# Patient Record
Sex: Female | Born: 1950 | Race: White | Hispanic: No | Smoking: Never smoker
Health system: Southern US, Community
[De-identification: ages and names within clinical notes are randomized; demographics above are authoritative.]

## PROBLEM LIST (undated history)

## (undated) DIAGNOSIS — T8859XA Other complications of anesthesia, initial encounter: Secondary | ICD-10-CM

## (undated) DIAGNOSIS — I10 Essential (primary) hypertension: Secondary | ICD-10-CM

## (undated) DIAGNOSIS — E119 Type 2 diabetes mellitus without complications: Secondary | ICD-10-CM

## (undated) DIAGNOSIS — G459 Transient cerebral ischemic attack, unspecified: Secondary | ICD-10-CM

## (undated) DIAGNOSIS — E7800 Pure hypercholesterolemia, unspecified: Secondary | ICD-10-CM

## (undated) DIAGNOSIS — G709 Myoneural disorder, unspecified: Secondary | ICD-10-CM

## (undated) DIAGNOSIS — F419 Anxiety disorder, unspecified: Secondary | ICD-10-CM

## (undated) DIAGNOSIS — M1990 Unspecified osteoarthritis, unspecified site: Secondary | ICD-10-CM

## (undated) HISTORY — PX: ABDOMINAL HYSTERECTOMY: SHX81

## (undated) HISTORY — PX: LAPAROSCOPY: SHX197

## (undated) HISTORY — PX: CERVICAL CONE BIOPSY: SUR198

## (undated) HISTORY — PX: DILATION AND CURETTAGE OF UTERUS: SHX78

---

## 2008-08-29 ENCOUNTER — Ambulatory Visit (HOSPITAL_COMMUNITY): Admission: RE | Admit: 2008-08-29 | Discharge: 2008-08-29 | Payer: Self-pay | Admitting: Family Medicine

## 2009-06-15 ENCOUNTER — Ambulatory Visit: Payer: Self-pay | Admitting: Diagnostic Radiology

## 2009-06-15 ENCOUNTER — Emergency Department (HOSPITAL_BASED_OUTPATIENT_CLINIC_OR_DEPARTMENT_OTHER): Admission: EM | Admit: 2009-06-15 | Discharge: 2009-06-15 | Payer: Self-pay | Admitting: Emergency Medicine

## 2009-09-09 ENCOUNTER — Ambulatory Visit (HOSPITAL_COMMUNITY): Admission: RE | Admit: 2009-09-09 | Discharge: 2009-09-09 | Payer: Self-pay | Admitting: Family Medicine

## 2012-05-04 ENCOUNTER — Emergency Department (HOSPITAL_BASED_OUTPATIENT_CLINIC_OR_DEPARTMENT_OTHER)

## 2012-05-04 ENCOUNTER — Encounter (HOSPITAL_BASED_OUTPATIENT_CLINIC_OR_DEPARTMENT_OTHER): Payer: Self-pay | Admitting: *Deleted

## 2012-05-04 ENCOUNTER — Emergency Department (HOSPITAL_BASED_OUTPATIENT_CLINIC_OR_DEPARTMENT_OTHER)
Admission: EM | Admit: 2012-05-04 | Discharge: 2012-05-04 | Disposition: A | Discharge status: Home / Self Care | Attending: Emergency Medicine | Admitting: Emergency Medicine

## 2012-05-04 DIAGNOSIS — Z79899 Other long term (current) drug therapy: Secondary | ICD-10-CM | POA: Insufficient documentation

## 2012-05-04 DIAGNOSIS — Z8673 Personal history of transient ischemic attack (TIA), and cerebral infarction without residual deficits: Secondary | ICD-10-CM | POA: Insufficient documentation

## 2012-05-04 DIAGNOSIS — R110 Nausea: Secondary | ICD-10-CM | POA: Insufficient documentation

## 2012-05-04 DIAGNOSIS — I10 Essential (primary) hypertension: Secondary | ICD-10-CM | POA: Insufficient documentation

## 2012-05-04 DIAGNOSIS — E7800 Pure hypercholesterolemia, unspecified: Secondary | ICD-10-CM | POA: Insufficient documentation

## 2012-05-04 DIAGNOSIS — R51 Headache: Secondary | ICD-10-CM | POA: Insufficient documentation

## 2012-05-04 HISTORY — DX: Anxiety disorder, unspecified: F41.9

## 2012-05-04 HISTORY — DX: Essential (primary) hypertension: I10

## 2012-05-04 HISTORY — DX: Pure hypercholesterolemia, unspecified: E78.00

## 2012-05-04 HISTORY — DX: Transient cerebral ischemic attack, unspecified: G45.9

## 2012-05-04 LAB — COMPREHENSIVE METABOLIC PANEL
AST: 28 U/L (ref 0–37)
Chloride: 102 mEq/L (ref 96–112)
Creatinine, Ser: 1 mg/dL (ref 0.50–1.10)
Glucose, Bld: 121 mg/dL — ABNORMAL HIGH (ref 70–99)
Sodium: 140 mEq/L (ref 135–145)
Total Protein: 7.6 g/dL (ref 6.0–8.3)

## 2012-05-04 LAB — CBC
HCT: 44.7 % (ref 36.0–46.0)
MCV: 83.7 fL (ref 78.0–100.0)
Platelets: 294 10*3/uL (ref 150–400)
RBC: 5.34 MIL/uL — ABNORMAL HIGH (ref 3.87–5.11)

## 2012-05-04 LAB — DIFFERENTIAL
Eosinophils Absolute: 0.1 10*3/uL (ref 0.0–0.7)
Lymphocytes Relative: 32 % (ref 12–46)
Monocytes Absolute: 0.5 10*3/uL (ref 0.1–1.0)
Monocytes Relative: 8 % (ref 3–12)
Neutro Abs: 3.4 10*3/uL (ref 1.7–7.7)

## 2012-05-04 MED ORDER — MECLIZINE HCL 25 MG PO TABS: 25.0000 mg | ORAL_TABLET | Freq: Three times a day (TID) | ORAL | Status: AC | PRN

## 2012-05-04 NOTE — ED Notes (Signed)
Patient ambulatory to triage; gait steady.  Patient reports headache and a "funny feeling" that started on Tuesday.

## 2012-05-04 NOTE — Discharge Instructions (Signed)

## 2012-05-04 NOTE — ED Provider Notes (Signed)
History     CSN: [id number]  Arrival date & time 05/04/12  1017   First MD Initiated Contact with Patient 05/04/12 1116      Chief Complaint  Patient presents with  . Headache  . Nausea    (Consider location/radiation/quality/duration/timing/severity/associated sxs/prior treatment) HPI Comments: Patient states has felt like vertigo coming on for several days.  Today started with headache, nausea. No injury or trauma.  No fevers or chills.  Patient is a 61 y.o. female presenting with headaches. The history is provided by the patient.  Headache  This is a new problem. Episode onset: this morning. The problem occurs constantly. The problem has not changed since onset.The headache is associated with nothing. The pain is located in the bilateral region. The quality of the pain is described as dull. The pain is moderate. The pain does not radiate. Associated symptoms include nausea. Pertinent negatives include no fever, no malaise/fatigue and no vomiting.    Past Medical History  Diagnosis Date  . TIA (transient ischemic attack)   . Anxiety   . Hypertension   . Hypercholesteremia   . Migraine     History reviewed. No pertinent past surgical history.  History reviewed. No pertinent family history.  History  Substance Use Topics  . Smoking status: Not on file  . Smokeless tobacco: Not on file  . Alcohol Use:     OB History    Grav Para Term Preterm Abortions TAB SAB Ect Mult Living                  Review of Systems  Constitutional: Negative for fever and malaise/fatigue.  Gastrointestinal: Positive for nausea. Negative for vomiting.  Neurological: Positive for headaches.  All other systems reviewed and are negative.    Allergies  Bactrim; Sulfa antibiotics; and Toprol xl  Home Medications   Current Outpatient Rx  Name Route Sig Dispense Refill  . AMLODIPINE BESYLATE 5 MG PO TABS Oral Take 5 mg by mouth daily.    Marland Kitchen ESTROGENS, CONJUGATED 0.625 MG/GM VA CREA  Vaginal Place vaginally daily.    Marland Kitchen GERITOL COMPLETE PO Oral Take by mouth.    Marland Kitchen PROPANTHELINE BROMIDE 15 MG PO TABS Oral Take 15 mg by mouth 4 (four) times daily.    Marland Kitchen ROSUVASTATIN CALCIUM 10 MG PO TABS Oral Take 10 mg by mouth daily.      BP 129/86  Pulse 71  Temp 98.5 F (36.9 C) (Oral)  Resp 20  Ht 5\' 6"  (1.676 m)  Wt 195 lb (88.451 kg)  BMI 31.47 kg/m2  SpO2 98%  Physical Exam  Nursing note and vitals reviewed. Constitutional: She is oriented to person, place, and time. She appears well-developed and well-nourished. No distress.  HENT:  Head: Normocephalic and atraumatic.  Eyes: EOM are normal. Pupils are equal, round, and reactive to light.  Neck: Normal range of motion. Neck supple.  Cardiovascular: Normal rate and regular rhythm.  Exam reveals no gallop and no friction rub.   No murmur heard. Pulmonary/Chest: Effort normal and breath sounds normal. No respiratory distress. She has no wheezes.  Abdominal: Soft. Bowel sounds are normal. She exhibits no distension. There is no tenderness.  Musculoskeletal: Normal range of motion.  Neurological: She is alert and oriented to person, place, and time. No cranial nerve deficit. She exhibits normal muscle tone. Coordination normal.  Skin: Skin is warm and dry. She is not diaphoretic.    ED Course  Procedures (including critical care time)  Labs Reviewed  CBC  DIFFERENTIAL  COMPREHENSIVE METABOLIC PANEL   No results found.   No diagnosis found.   Date: 05/04/2012  Rate: 63  Rhythm: normal sinus rhythm  QRS Axis: normal  Intervals: normal  ST/T Wave abnormalities: normal  Conduction Disutrbances:none  Narrative Interpretation:   Old EKG Reviewed: none available    MDM  The labs, ct, and ekg all look okay.  There is no sign of mass, bleed, and I do not feel as though an LP is indicated.  She seems quite anxious and I suspect this is adding to the way she feels.  She will be discharged to home, to return prn if  worsens and see pcp if she does not improve.        Geoffery Lyons, MD 05/04/12 1243

## 2012-05-04 NOTE — ED Notes (Signed)
Pt amb to room 4 with quick steady gait in nad. Pt reports ha since yesterday with some nausea. On Tuesday had her usual vertigo symptoms, but those resolved, ha and nausea continue.

## 2012-06-02 ENCOUNTER — Other Ambulatory Visit (HOSPITAL_COMMUNITY): Payer: Self-pay | Admitting: Family Medicine

## 2012-06-02 ENCOUNTER — Ambulatory Visit (HOSPITAL_COMMUNITY)
Admission: RE | Admit: 2012-06-02 | Discharge: 2012-06-02 | Disposition: A | Discharge status: Home / Self Care | Source: Ambulatory Visit | Attending: Family Medicine | Admitting: Family Medicine

## 2012-06-02 DIAGNOSIS — M1990 Unspecified osteoarthritis, unspecified site: Secondary | ICD-10-CM

## 2012-06-02 DIAGNOSIS — M79609 Pain in unspecified limb: Secondary | ICD-10-CM | POA: Insufficient documentation

## 2012-06-02 DIAGNOSIS — M25569 Pain in unspecified knee: Secondary | ICD-10-CM | POA: Insufficient documentation

## 2014-06-20 IMAGING — CR DG TIBIA/FIBULA 2V*R*
2 series · 2 of 2 positions shown · non-contrast
Comparison: None.

CLINICAL DATA: Fell.  Lower extremity pain.

RIGHT TIBIA AND FIBULA - 2 VIEW

[view not recorded (1 of 2)]
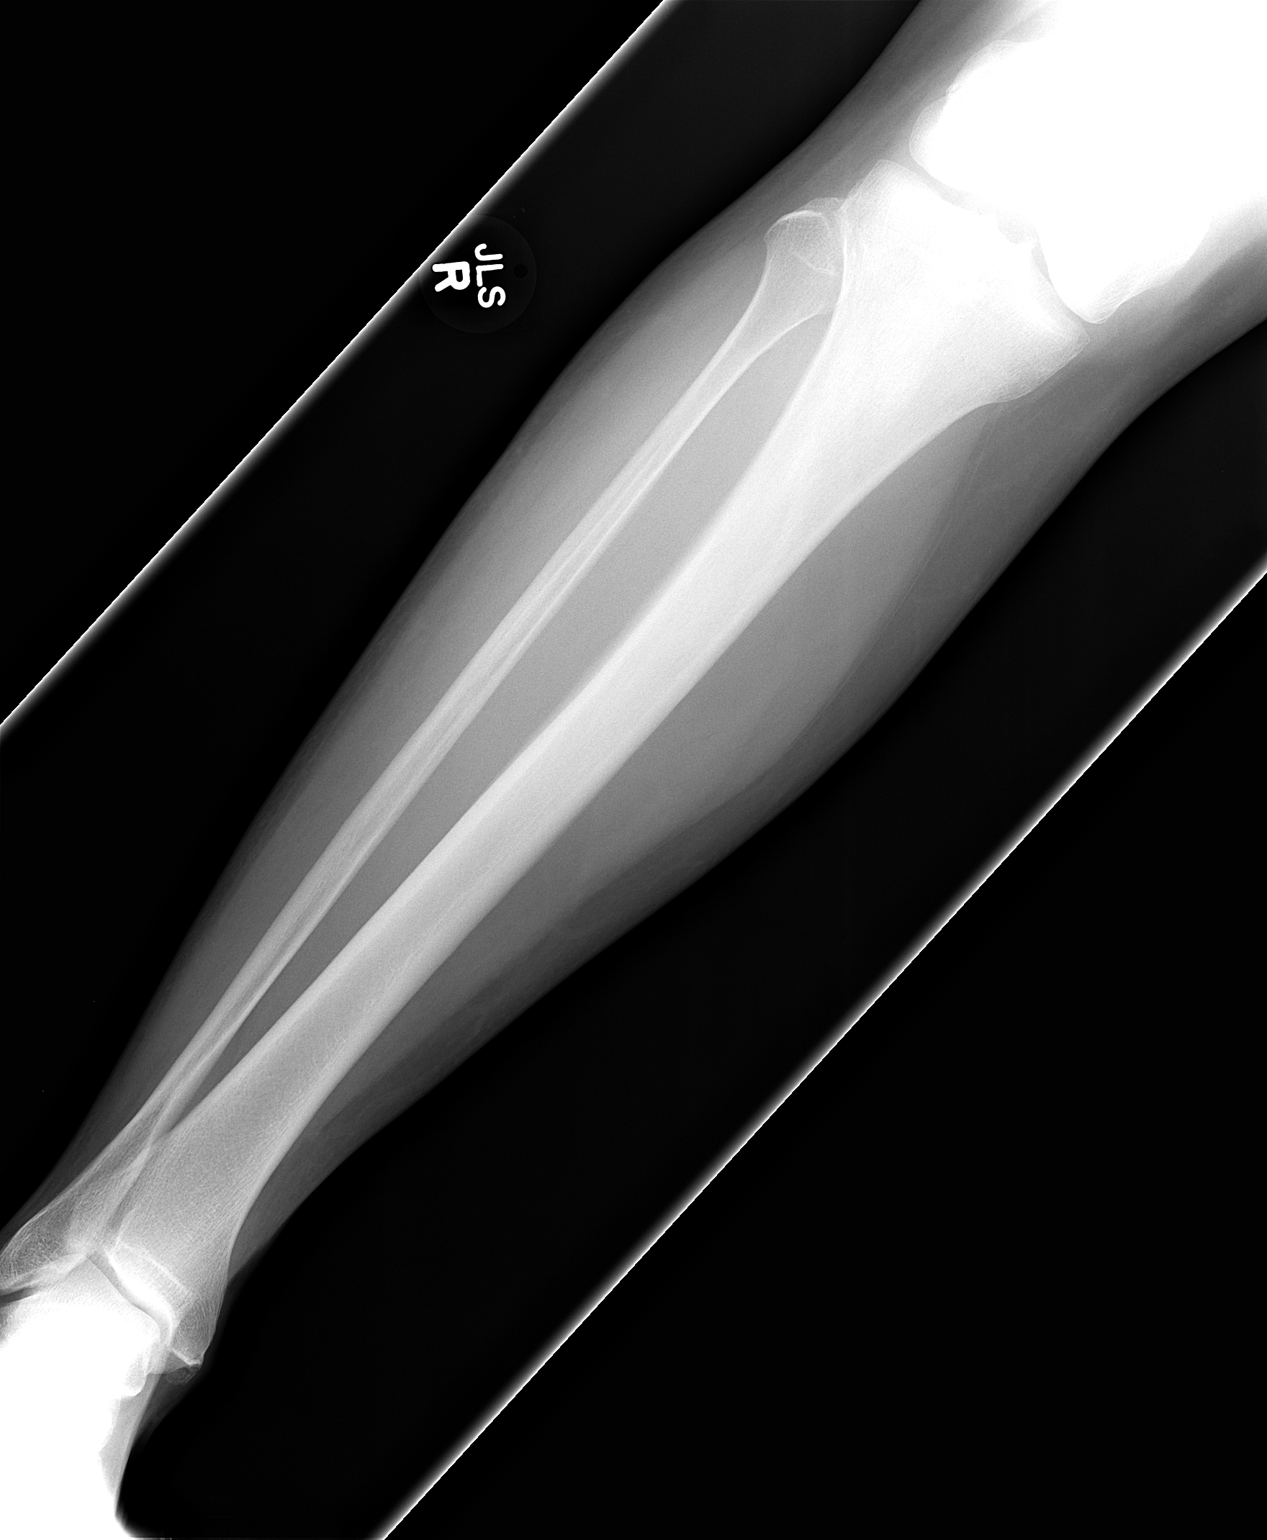

[view not recorded (2 of 2)]
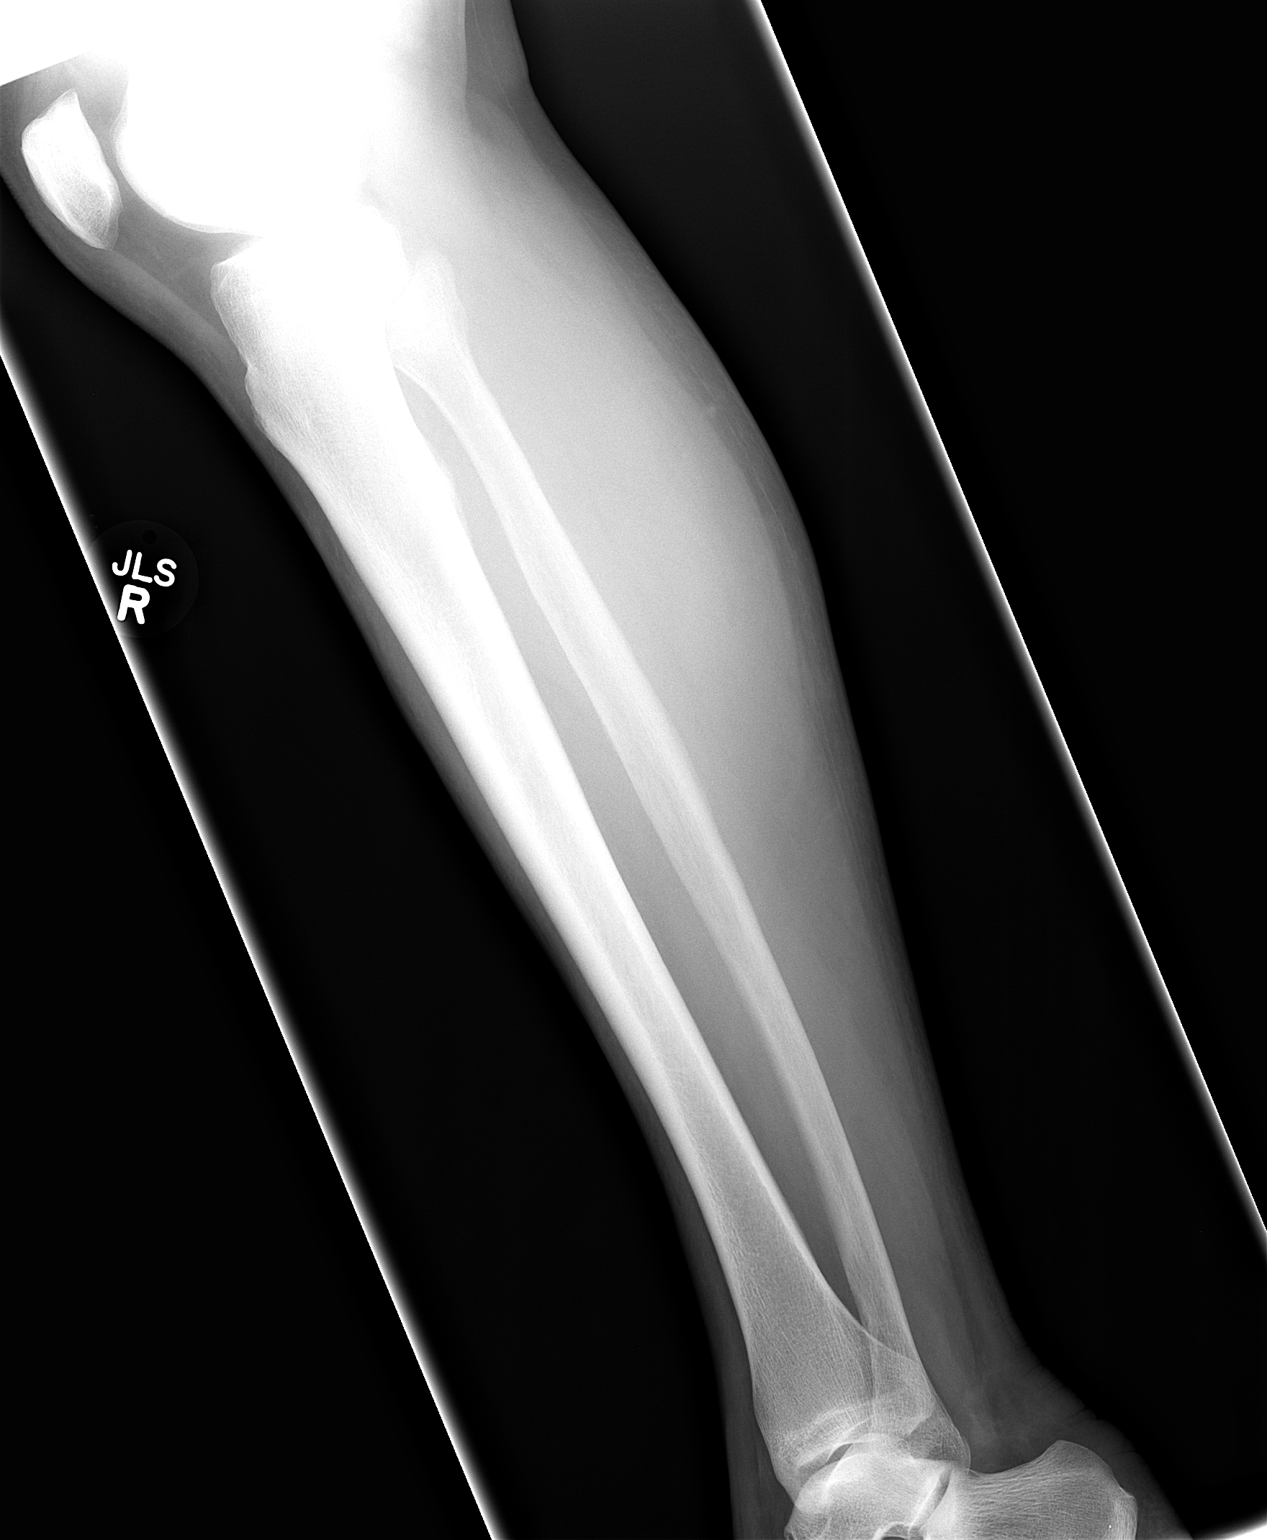

[2 of 2 positions shown; findings below may reference images not displayed]

FINDINGS: No evidence of fracture or other focal bony lesion.  No
soft tissue finding.
IMPRESSION: Negative radiographs

## 2015-02-09 ENCOUNTER — Emergency Department (HOSPITAL_BASED_OUTPATIENT_CLINIC_OR_DEPARTMENT_OTHER)
Admission: EM | Admit: 2015-02-09 | Discharge: 2015-02-09 | Disposition: A | Discharge status: Home / Self Care | Attending: Emergency Medicine | Admitting: Emergency Medicine

## 2015-02-09 ENCOUNTER — Emergency Department (HOSPITAL_BASED_OUTPATIENT_CLINIC_OR_DEPARTMENT_OTHER)

## 2015-02-09 ENCOUNTER — Encounter (HOSPITAL_BASED_OUTPATIENT_CLINIC_OR_DEPARTMENT_OTHER): Payer: Self-pay

## 2015-02-09 DIAGNOSIS — Z79899 Other long term (current) drug therapy: Secondary | ICD-10-CM | POA: Insufficient documentation

## 2015-02-09 DIAGNOSIS — I10 Essential (primary) hypertension: Secondary | ICD-10-CM | POA: Insufficient documentation

## 2015-02-09 DIAGNOSIS — W010XXA Fall on same level from slipping, tripping and stumbling without subsequent striking against object, initial encounter: Secondary | ICD-10-CM | POA: Diagnosis not present

## 2015-02-09 DIAGNOSIS — Z8673 Personal history of transient ischemic attack (TIA), and cerebral infarction without residual deficits: Secondary | ICD-10-CM | POA: Insufficient documentation

## 2015-02-09 DIAGNOSIS — Z7982 Long term (current) use of aspirin: Secondary | ICD-10-CM | POA: Insufficient documentation

## 2015-02-09 DIAGNOSIS — E780 Pure hypercholesterolemia: Secondary | ICD-10-CM | POA: Insufficient documentation

## 2015-02-09 DIAGNOSIS — F419 Anxiety disorder, unspecified: Secondary | ICD-10-CM | POA: Diagnosis not present

## 2015-02-09 DIAGNOSIS — Y9289 Other specified places as the place of occurrence of the external cause: Secondary | ICD-10-CM | POA: Diagnosis not present

## 2015-02-09 DIAGNOSIS — Y9389 Activity, other specified: Secondary | ICD-10-CM | POA: Insufficient documentation

## 2015-02-09 DIAGNOSIS — S93402A Sprain of unspecified ligament of left ankle, initial encounter: Secondary | ICD-10-CM

## 2015-02-09 DIAGNOSIS — S99912A Unspecified injury of left ankle, initial encounter: Secondary | ICD-10-CM | POA: Diagnosis present

## 2015-02-09 DIAGNOSIS — Y998 Other external cause status: Secondary | ICD-10-CM | POA: Insufficient documentation

## 2015-02-09 MED ORDER — ACETAMINOPHEN 325 MG PO TABS
975.0000 mg | ORAL_TABLET | Freq: Once | ORAL | Status: AC
  Administered 2015-02-09: 975 mg via ORAL
  Filled 2015-02-09: qty 3

## 2015-02-09 NOTE — Discharge Instructions (Signed)
Rest, Ice intermittently (in the first 24-48 hours), Gentle compression with an Ace wrap, and elevate (Limb above the level of the heart)   Take up to  of ibuprofen (that is usually 4 over the counter pills)  3 times a day for 5 days. Take with food.  Take acetaminophen (Tylenol) up to 975 mg (this is normally 3 over-the-counter pills) up to 3 times a day. Do not drink alcohol. Make sure your other medications do not contain acetaminophen (Read the labels!)  Please follow with your primary care doctor in the next 2 days for a check-up. They must obtain records for further management.   Do not hesitate to return to the Emergency Department for any new, worsening or concerning symptoms.    Ankle Sprain An ankle sprain is an injury to the strong, fibrous tissues (ligaments) that hold the bones of your ankle joint together.  CAUSES An ankle sprain is usually caused by a fall or by twisting your ankle. Ankle sprains most commonly occur when you step on the outer edge of your foot, and your ankle turns inward. People who participate in sports are more prone to these types of injuries.  SYMPTOMS   Pain in your ankle. The pain may be present at rest or only when you are trying to stand or walk.  Swelling.  Bruising. Bruising may develop immediately or within 1 to 2 days after your injury.  Difficulty standing or walking, particularly when turning corners or changing directions. DIAGNOSIS  Your caregiver will ask you details about your injury and perform a physical exam of your ankle to determine if you have an ankle sprain. During the physical exam, your caregiver will press on and apply pressure to specific areas of your foot and ankle. Your caregiver will try to move your ankle in certain ways. An X-ray exam may be done to be sure a bone was not broken or a ligament did not separate from one of the bones in your ankle (avulsion fracture).  TREATMENT  Certain types of braces can help  stabilize your ankle. Your caregiver can make a recommendation for this. Your caregiver may recommend the use of medicine for pain. If your sprain is severe, your caregiver may refer you to a surgeon who helps to restore function to parts of your skeletal system (orthopedist) or a physical therapist. HOME CARE INSTRUCTIONS   Apply ice to your injury for 1-2 days or as directed by your caregiver. Applying ice helps to reduce inflammation and pain.  Put ice in a plastic bag.  Place a towel between your skin and the bag.  Leave the ice on for 15-20 minutes at a time, every 2 hours while you are awake.  Only take over-the-counter or prescription medicines for pain, discomfort, or fever as directed by your caregiver.  Elevate your injured ankle above the level of your heart as much as possible for 2-3 days.  If your caregiver recommends crutches, use them as instructed. Gradually put weight on the affected ankle. Continue to use crutches or a cane until you can walk without feeling pain in your ankle.  If you have a plaster splint, wear the splint as directed by your caregiver. Do not rest it on anything harder than a pillow for the first 24 hours. Do not put weight on it. Do not get it wet. You may take it off to take a shower or bath.  You may have been given an elastic bandage to wear around your  ankle to provide support. If the elastic bandage is too tight (you have numbness or tingling in your foot or your foot becomes cold and blue), adjust the bandage to make it comfortable.  If you have an air splint, you may blow more air into it or let air out to make it more comfortable. You may take your splint off at night and before taking a shower or bath. Wiggle your toes in the splint several times per day to decrease swelling. SEEK MEDICAL CARE IF:   You have rapidly increasing bruising or swelling.  Your toes feel extremely cold or you lose feeling in your foot.  Your pain is not relieved  with medicine. SEEK IMMEDIATE MEDICAL CARE IF:  Your toes are numb or blue.  You have severe pain that is increasing. MAKE SURE YOU:   Understand these instructions.  Will watch your condition.  Will get help right away if you are not doing well or get worse. Document Released: 11/01/2005 Document Revised: 07/26/2012 Document Reviewed: 11/13/2011 [facility] Patient Information 2015 [place] [place]. This information is not intended to replace advice given to you by your health care provider. Make sure you discuss any questions you have with your health care provider.

## 2015-02-09 NOTE — ED Notes (Signed)
PA at bedside.

## 2015-02-09 NOTE — ED Provider Notes (Signed)
CSN: [id number]     Arrival date & time 02/09/15  1236 History   First MD Initiated Contact with Patient 02/09/15 1329     Chief Complaint  Patient presents with  . Ankle Injury     (Consider location/radiation/quality/duration/timing/severity/associated sxs/prior Treatment) HPI  Madison Tate is a 64 y.o. female complaining of moderate pain to left lateral ankle status post slip and fall last night, patient has been ambulatory with extreme pain and difficulty. Patient states that she was working in the yard last night, her foot got tangled in an extension cord and she fell down 2 steps. There was no head trauma, cervicalgia, chest pain, abdominal pain, difficulty moving major joints besides the left ankle. Rates her pain is 7 out of 10, she is taking Motrin at home with little relief. She denies weakness, numbness, history of prior trauma or surgeries to the affected joint.   Past Medical History  Diagnosis Date  . TIA (transient ischemic attack)   . Anxiety   . Hypertension   . Hypercholesteremia    History reviewed. No pertinent past surgical history. No family history on file. History  Substance Use Topics  . Smoking status: Never Smoker   . Smokeless tobacco: Not on file  . Alcohol Use: Not on file   OB History    No data available     Review of Systems  10 systems reviewed and found to be negative, except as noted in the HPI.   Allergies  Bactrim; Sulfa antibiotics; and Toprol xl  Home Medications   Prior to Admission medications   Medication Sig Start Date End Date Taking? Authorizing Provider  aspirin 325 MG tablet Take 325 mg by mouth daily.   Yes Historical Provider, MD  propranolol (INDERAL) 20 MG tablet Take 20 mg by mouth 3 (three) times daily.   Yes Historical Provider, MD  amLODipine (NORVASC) 5 MG tablet Take 5 mg by mouth daily.    Historical Provider, MD  conjugated estrogens (PREMARIN) vaginal cream Place vaginally daily.    Historical Provider, MD   Iron-Vitamins (GERITOL COMPLETE PO) Take by mouth.    Historical Provider, MD  propantheline (PROBANTHINE) 15 MG tablet Take 15 mg by mouth 4 (four) times daily.    Historical Provider, MD  rosuvastatin (CRESTOR) 10 MG tablet Take 10 mg by mouth daily.    Historical Provider, MD   BP 121/61 mmHg  Pulse 73  Temp(Src) 98.6 F (37 C) (Oral)  Resp 18  SpO2 98% Physical Exam  Constitutional: She is oriented to person, place, and time. She appears well-developed and well-nourished. No distress.  HENT:  Head: Normocephalic.  Eyes: Conjunctivae and EOM are normal.  Cardiovascular: Normal rate.   Pulmonary/Chest: Effort normal. No stridor.  Musculoskeletal: Normal range of motion. She exhibits edema and tenderness.  Mild to moderate swelling and tenderness palpation along the left lateral malleolus inferior area. Neurovascularly intact. Overlying skin is intact.  Neurological: She is alert and oriented to person, place, and time.  Psychiatric: She has a normal mood and affect.  Nursing note and vitals reviewed.   ED Course  Procedures (including critical care time) Labs Review Labs Reviewed - No data to display  Imaging Review Dg Ankle Complete Left  02/09/2015   CLINICAL DATA:  Tripped and fell, left ankle pain and trauma yesterday  EXAM: LEFT ANKLE COMPLETE - 3+ VIEW  COMPARISON:  None.  FINDINGS: Mild lateral malleolar soft tissue swelling is evident. The mortise is symmetric. No fracture  or dislocation is identified. No radiopaque foreign body.  IMPRESSION: Mild lateral malleolar soft tissue swelling which may indicate ligamentous injury.   Electronically Signed   By: Christiana Pellant M.D.   On: 02/09/2015 13:22     EKG Interpretation None      MDM   Final diagnoses:  Ankle sprain, left, initial encounter    Filed Vitals:   02/09/15 1248  BP: 121/61  Pulse: 73  Temp: 98.6 F (37 C)  TempSrc: Oral  Resp: 18  SpO2: 98%    Madison Tate is a pleasant 64 y.o. female  presenting with ankle pain status post slip and fall last night.Neurovascularly intact with mild tenderness to palpation. X-rays negative. Will treat with rest, ice, compression elevation, NSAIDS. Patient given crutches and an orthopedic follow-up.  Patient declines pain medication in the ED.  Evaluation does not show pathology that would require ongoing emergent intervention or inpatient treatment. Pt is hemodynamically stable and mentating appropriately. Discussed findings and plan with patient/guardian, who agrees with care plan. All questions answered. Return precautions discussed and outpatient follow up given.       Wynetta Emery, PA-C 02/09/15 1408  Jerelyn Scott, MD 02/09/15 1409

## 2015-02-09 NOTE — ED Notes (Signed)
Pt requesting pain meds, p.a notified.

## 2015-02-09 NOTE — ED Notes (Signed)
Pt reports last night twisted L ankle, swelling and tenderness with some bruising to L lateral ankle and L anterior foot area.  Able to wiggle toes, reported numbness to toe area.  Reports unable to bear weight d/t pain currently.

## 2015-02-09 NOTE — ED Notes (Signed)
Patient here with left ankle injury after tripping and falling last pm, pain with any attempt to ambulate

## 2017-02-26 IMAGING — DX DG ANKLE COMPLETE 3+V*L*
3 series · 3 of 3 positions shown · non-contrast
Comparison: None.

CLINICAL DATA: Tripped and fell, left ankle pain and trauma
yesterday

EXAM:
LEFT ANKLE COMPLETE - 3+ VIEW

[ankle ap]
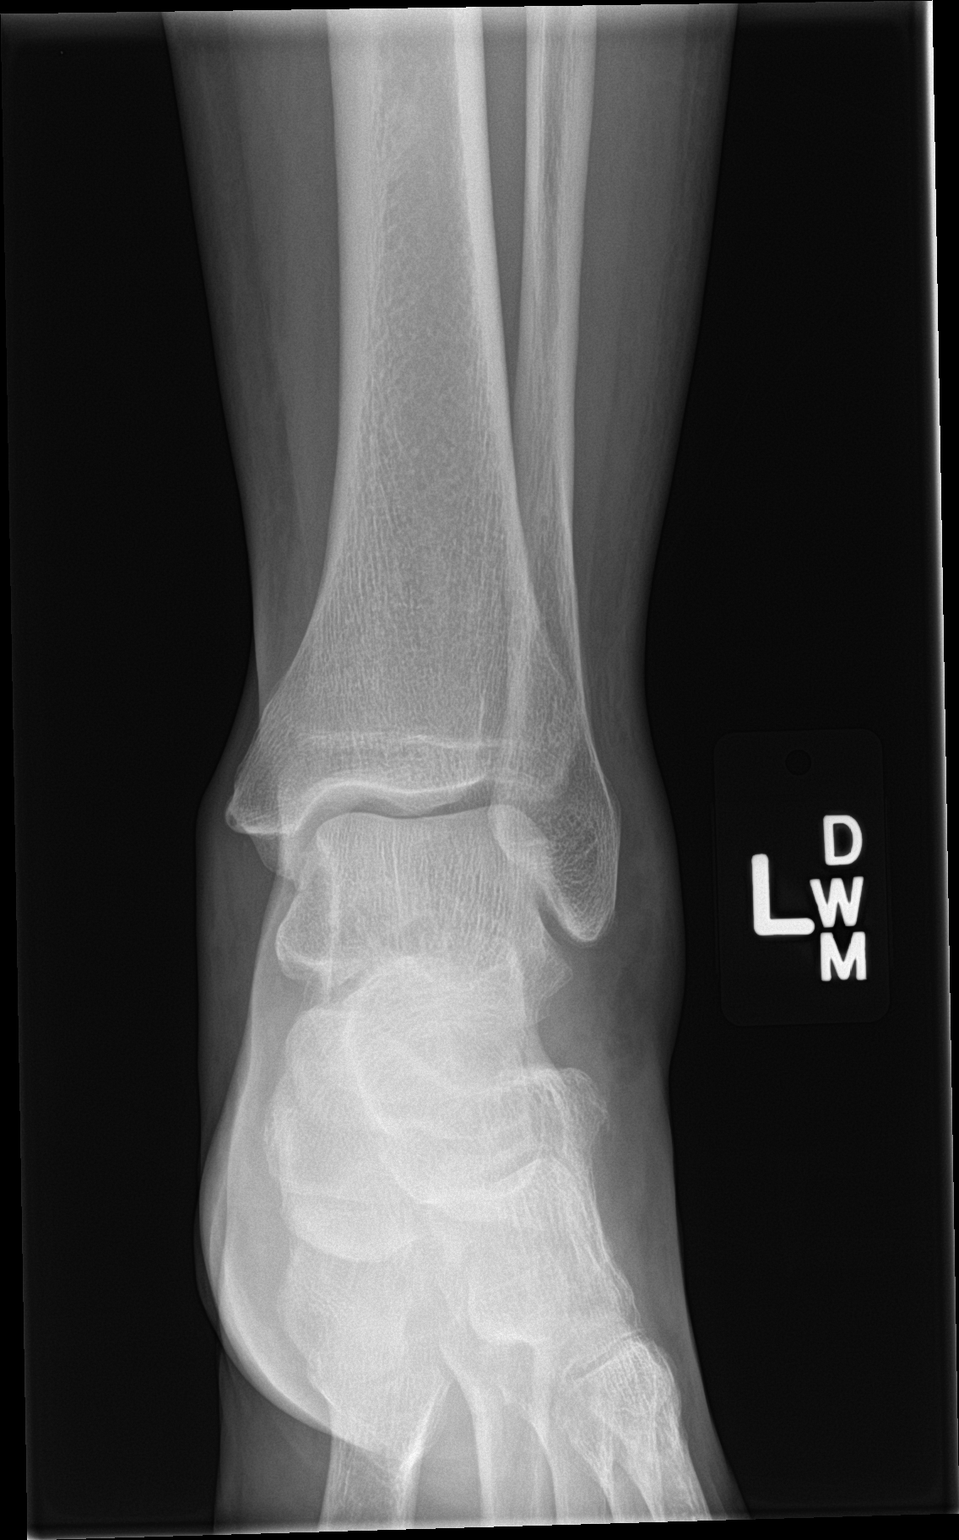

[ankle obl]
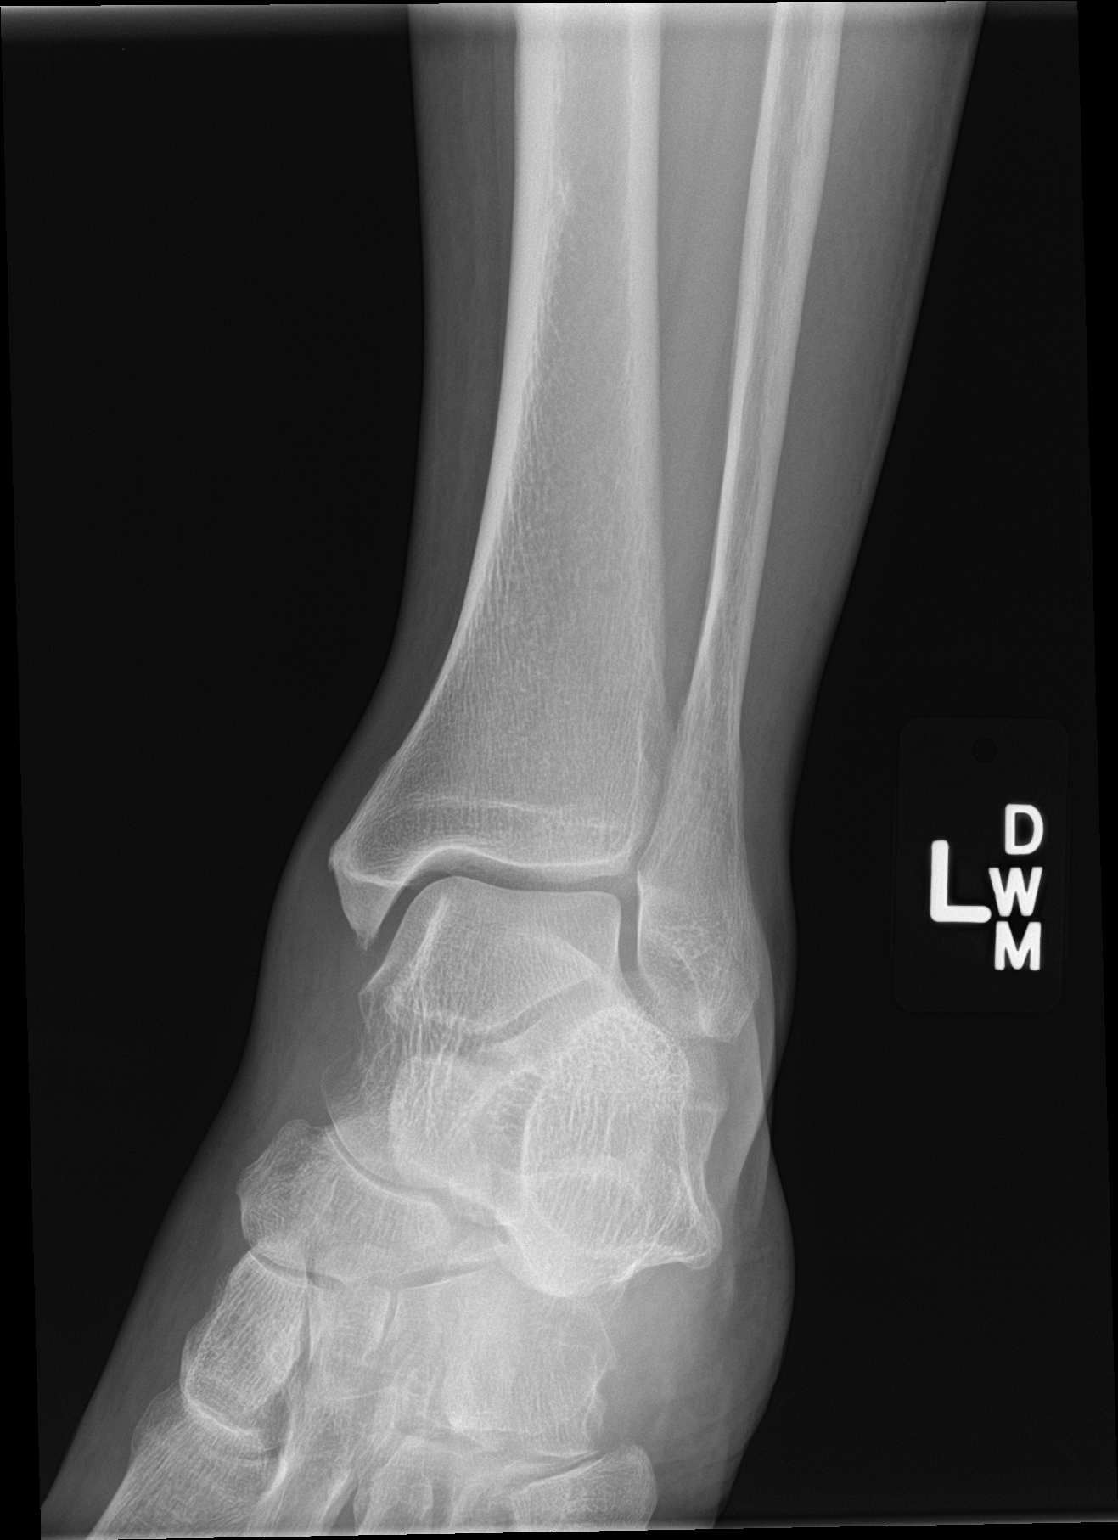

[ankle lat]
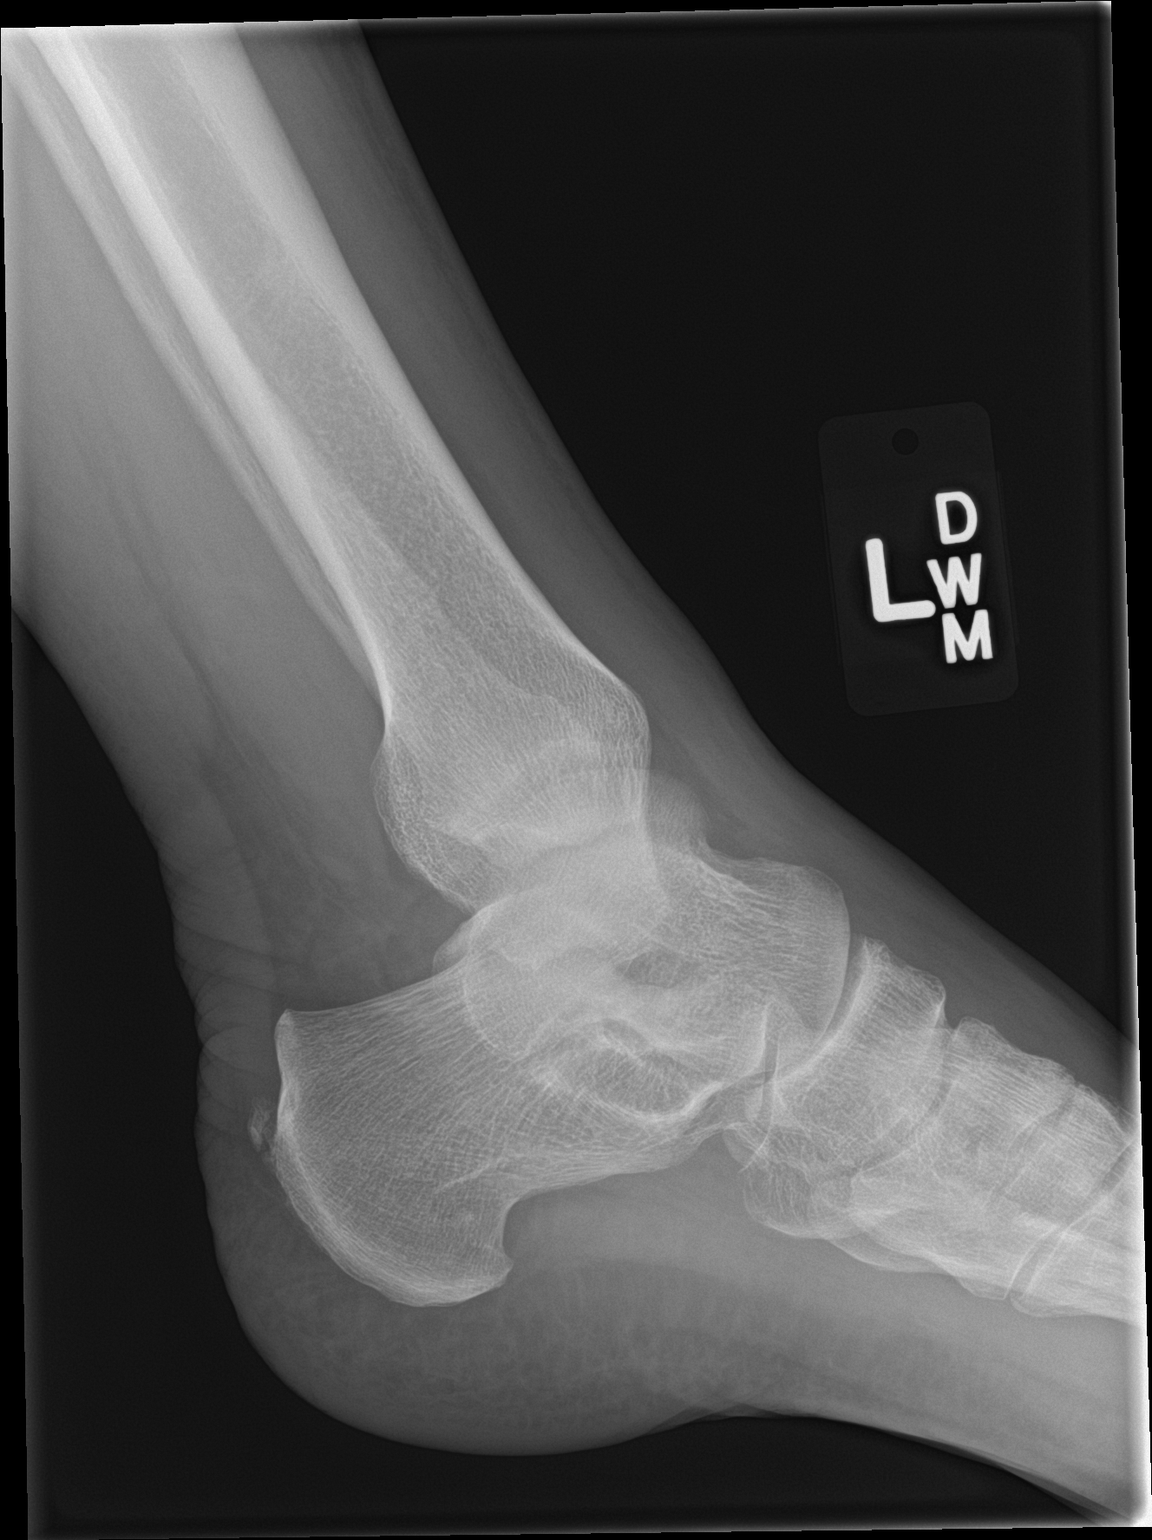

[3 of 3 positions shown; findings below may reference images not displayed]

FINDINGS: Mild lateral malleolar soft tissue swelling is evident. The mortise
is symmetric. No fracture or dislocation is identified. No
radiopaque foreign body.
IMPRESSION: Mild lateral malleolar soft tissue swelling which may indicate
ligamentous injury.

## 2019-06-03 ENCOUNTER — Encounter (HOSPITAL_BASED_OUTPATIENT_CLINIC_OR_DEPARTMENT_OTHER): Payer: Self-pay

## 2019-06-03 ENCOUNTER — Other Ambulatory Visit: Payer: Self-pay

## 2019-06-03 ENCOUNTER — Emergency Department (HOSPITAL_BASED_OUTPATIENT_CLINIC_OR_DEPARTMENT_OTHER)
Admission: EM | Admit: 2019-06-03 | Discharge: 2019-06-03 | Disposition: A | Discharge status: Home / Self Care | Payer: Medicare Other | Attending: Emergency Medicine | Admitting: Emergency Medicine

## 2019-06-03 DIAGNOSIS — R630 Anorexia: Secondary | ICD-10-CM | POA: Insufficient documentation

## 2019-06-03 DIAGNOSIS — Z8673 Personal history of transient ischemic attack (TIA), and cerebral infarction without residual deficits: Secondary | ICD-10-CM | POA: Insufficient documentation

## 2019-06-03 DIAGNOSIS — W57XXXA Bitten or stung by nonvenomous insect and other nonvenomous arthropods, initial encounter: Secondary | ICD-10-CM | POA: Diagnosis not present

## 2019-06-03 DIAGNOSIS — H9201 Otalgia, right ear: Secondary | ICD-10-CM | POA: Diagnosis not present

## 2019-06-03 DIAGNOSIS — Z20828 Contact with and (suspected) exposure to other viral communicable diseases: Secondary | ICD-10-CM | POA: Diagnosis not present

## 2019-06-03 DIAGNOSIS — I10 Essential (primary) hypertension: Secondary | ICD-10-CM | POA: Diagnosis not present

## 2019-06-03 DIAGNOSIS — R509 Fever, unspecified: Secondary | ICD-10-CM | POA: Insufficient documentation

## 2019-06-03 DIAGNOSIS — Z7982 Long term (current) use of aspirin: Secondary | ICD-10-CM | POA: Diagnosis not present

## 2019-06-03 DIAGNOSIS — Z79899 Other long term (current) drug therapy: Secondary | ICD-10-CM | POA: Diagnosis not present

## 2019-06-03 DIAGNOSIS — R5383 Other fatigue: Secondary | ICD-10-CM | POA: Insufficient documentation

## 2019-06-03 DIAGNOSIS — S30861A Insect bite (nonvenomous) of abdominal wall, initial encounter: Secondary | ICD-10-CM | POA: Diagnosis not present

## 2019-06-03 DIAGNOSIS — Y999 Unspecified external cause status: Secondary | ICD-10-CM | POA: Insufficient documentation

## 2019-06-03 DIAGNOSIS — E119 Type 2 diabetes mellitus without complications: Secondary | ICD-10-CM | POA: Insufficient documentation

## 2019-06-03 DIAGNOSIS — Y939 Activity, unspecified: Secondary | ICD-10-CM | POA: Insufficient documentation

## 2019-06-03 DIAGNOSIS — Y929 Unspecified place or not applicable: Secondary | ICD-10-CM | POA: Diagnosis not present

## 2019-06-03 HISTORY — DX: Type 2 diabetes mellitus without complications: E11.9

## 2019-06-03 LAB — URINALYSIS, ROUTINE W REFLEX MICROSCOPIC
Bilirubin Urine: NEGATIVE
Glucose, UA: NEGATIVE mg/dL
Ketones, ur: NEGATIVE mg/dL
Leukocytes,Ua: NEGATIVE
Nitrite: NEGATIVE
Protein, ur: NEGATIVE mg/dL
Specific Gravity, Urine: 1.025 (ref 1.005–1.030)
pH: 6 (ref 5.0–8.0)

## 2019-06-03 LAB — URINALYSIS, MICROSCOPIC (REFLEX)

## 2019-06-03 MED ORDER — DOXYCYCLINE HYCLATE 100 MG PO CAPS: 100.0000 mg | ORAL_CAPSULE | Freq: Two times a day (BID) | ORAL | 0 refills | Status: DC

## 2019-06-03 NOTE — ED Triage Notes (Signed)
Pt last took 325mg  of APAP at 20:30

## 2019-06-03 NOTE — ED Triage Notes (Signed)
Pt started having and ear ache on the R side that started on Tuesday. Pt states Friday she started running a low grade fever with TMax 100. Pt reports HA, body aches, weakness, and decreased appetite. Pt does have a "tick bite" on abd with localized erythema.

## 2019-06-03 NOTE — Discharge Instructions (Signed)
Follow-up with your primary care provider for recheck.  Return to emergency department if you have any worsening symptoms.  You need to be in quarantine until your [facility] test comes back or at least 10 days.

## 2019-06-03 NOTE — ED Provider Notes (Signed)
[place] EMERGENCY DEPARTMENT Provider Note   CSN: [id number] Arrival date & time: 06/03/19  2140    History   Chief Complaint Chief Complaint  Patient presents with  . Fever    HPI Madison Tate is a 68 y.o. female.     Patient is a 68 year old female who presents with fatigue and low-grade fevers.  She states that Monday which was about 6 days ago she had a dental cleaning.  The next day she felt like her gland was swollen a little bit on the outside of her neck and her ear was a little sore.  However she felt better the next day but following that she noticed that she had a decreased appetite and general fatigue.  She is had some ongoing low-grade fever since then up to 100.  She denies any URI symptoms.  No nausea or vomiting.  She has had a decreased appetite.  No abdominal pain.  No cough or chest pain.  No shortness of breath.  No urinary symptoms.  No ongoing or worsening ear pain.  She does state that she had a tick bite about 2 weeks ago when she pulled the tick off her abdomen.  Is been a little red in the area since that time.  She denies any other rashes.     Past Medical History:  Diagnosis Date  . Anxiety   . Diabetes mellitus without complication ([place])   . Hypercholesteremia   . Hypertension   . TIA (transient ischemic attack)     There are no active problems to display for this patient.   Past Surgical History:  Procedure Laterality Date  . ABDOMINAL HYSTERECTOMY       OB History   No obstetric history on file.      Home Medications    Prior to Admission medications   Medication Sig Start Date End Date Taking? Authorizing Provider  amLODipine (NORVASC) 5 MG tablet Take 5 mg by mouth daily.    [provider]  aspirin 325 MG tablet Take 325 mg by mouth daily.    [provider]  conjugated estrogens (PREMARIN) vaginal cream Place vaginally daily.    [provider]  doxycycline (VIBRAMYCIN) 100 MG capsule  Take 1 capsule (100 mg total) by mouth 2 (two) times daily. One po bid x 7 days 06/03/19   Malvin Johns, MD  Iron-Vitamins (GERITOL COMPLETE PO) Take by mouth.    [provider]  propantheline (PROBANTHINE) 15 MG tablet Take 15 mg by mouth 4 (four) times daily.    [provider]  propranolol (INDERAL) 20 MG tablet Take 20 mg by mouth 3 (three) times daily.    [provider]  rosuvastatin (CRESTOR) 10 MG tablet Take 10 mg by mouth daily.    [provider]    Family History No family history on file.  Social History Social History   Tobacco Use  . Smoking status: Never Smoker  Substance Use Topics  . Alcohol use: Not on file  . Drug use: Not on file     Allergies   Bactrim [sulfamethoxazole-trimethoprim], Sulfa antibiotics, Topiramate, and Toprol xl [metoprolol tartrate]   Review of Systems Review of Systems  Constitutional: Positive for appetite change, fatigue and fever. Negative for chills and diaphoresis.  HENT: Positive for ear pain. Negative for congestion, rhinorrhea and sneezing.   Eyes: Negative.   Respiratory: Negative for cough, chest tightness and shortness of breath.   Cardiovascular: Negative for chest pain  and leg swelling.  Gastrointestinal: Negative for abdominal pain, blood in stool, diarrhea, nausea and vomiting.  Genitourinary: Negative for difficulty urinating, flank pain, frequency and hematuria.  Musculoskeletal: Negative for arthralgias and back pain.  Skin: Positive for wound. Negative for rash.  Neurological: Negative for dizziness, speech difficulty, weakness, numbness and headaches.     Physical Exam Updated Vital Signs BP (!) 164/88 (BP Location: Right Arm)   Pulse 83   Temp 99.4 F (37.4 C) (Oral)   Resp 20   Ht 5\' 6"  (1.676 m)   Wt 89.4 kg   SpO2 97%   BMI 31.80 kg/m   Physical Exam Constitutional:      Appearance: She is well-developed.  HENT:     Head: Normocephalic and atraumatic.      Right Ear: Tympanic membrane normal.     Left Ear: Tympanic membrane normal.     Mouth/Throat:     Mouth: Mucous membranes are dry.     Pharynx: No oropharyngeal exudate or posterior oropharyngeal erythema.  Eyes:     Conjunctiva/sclera: Conjunctivae normal.     Pupils: Pupils are equal, round, and reactive to light.  Neck:     Musculoskeletal: Normal range of motion and neck supple.  Cardiovascular:     Rate and Rhythm: Normal rate and regular rhythm.     Heart sounds: Normal heart sounds.  Pulmonary:     Effort: Pulmonary effort is normal. No respiratory distress.     Breath sounds: Normal breath sounds. No wheezing or rales.  Chest:     Chest wall: No tenderness.  Abdominal:     General: Bowel sounds are normal.     Palpations: Abdomen is soft.     Tenderness: There is no abdominal tenderness. There is no guarding or rebound.  Musculoskeletal: Normal range of motion.  Lymphadenopathy:     Cervical: No cervical adenopathy.  Skin:    General: Skin is warm and dry.     Findings: No rash.     Comments: Patient has a small area of erythema on her anterior abdominal wall with a central scab.  There is no induration or fluctuance.  Neurological:     Mental Status: She is alert and oriented to person, place, and time.      ED Treatments / Results  Labs (all labs ordered are listed, but only abnormal results are displayed) Labs Reviewed  URINALYSIS, ROUTINE W REFLEX MICROSCOPIC - Abnormal; Notable for the following components:      Result Value   Hgb urine dipstick MODERATE (*)    All other components within normal limits  URINALYSIS, MICROSCOPIC (REFLEX) - Abnormal; Notable for the following components:   Bacteria, UA RARE (*)    All other components within normal limits  NOVEL CORONAVIRUS, NAA (HOSPITAL ORDER, SEND-OUT TO REF LAB)  ROCKY MTN SPOTTED FVR ABS PNL(IGG+IGM)  B. BURGDORFI ANTIBODIES    EKG None  Radiology No results found.  Procedures Procedures  (including critical care time)  Medications Ordered in ED Medications - No data to display   Initial Impression / Assessment and Plan / ED Course  I have reviewed the triage vital signs and the nursing notes.  Pertinent labs & imaging results that were available during my care of the patient were reviewed by me and considered in my medical decision making (see chart for details).        Patient is a 68 year old female who presents with fever and achiness.  She does not have any  clinical suggestions of pneumonia.  Her urine does not appear to be infected.  There is no evidence of a throat or ear infection.  COVID test is pending.  [facility] also [facility] and Lyme disease and given the tick bite, I will go ahead and start her on doxycycline.  She was encouraged to follow-up with her PCP.  Return precautions were given.  Final Clinical Impressions(s) / ED Diagnoses   Final diagnoses:  Febrile illness    ED Discharge Orders         Ordered    doxycycline (VIBRAMYCIN) 100 MG capsule  2 times daily     06/03/19 2249           Rolan BuccoBelfi, Teddi Badalamenti, MD 06/03/19 2251

## 2019-06-03 NOTE — ED Notes (Signed)
Pt understood dc material. NAD noted. Script given at dc. All questions answered to satisfaction. Pt escorted to check out window. 

## 2019-06-05 LAB — B. BURGDORFI ANTIBODIES: B burgdorferi Ab IgG+IgM: <0.91 {ISR} (ref 0.00–0.90)

## 2019-06-06 LAB — ROCKY MTN SPOTTED FVR ABS PNL(IGG+IGM)
RMSF IgG: NEGATIVE
RMSF IgM: 0.26 index (ref 0.00–0.89)

## 2019-06-06 LAB — NOVEL CORONAVIRUS, NAA (HOSP ORDER, SEND-OUT TO REF LAB; TAT 18-24 HRS): SARS-CoV-2, NAA: NOT DETECTED

## 2021-01-09 LAB — COLOGUARD: COLOGUARD: NEGATIVE

## 2022-12-30 ENCOUNTER — Other Ambulatory Visit: Payer: Self-pay

## 2022-12-30 ENCOUNTER — Emergency Department (HOSPITAL_BASED_OUTPATIENT_CLINIC_OR_DEPARTMENT_OTHER)
Admission: EM | Admit: 2022-12-30 | Discharge: 2022-12-30 | Disposition: A | Discharge status: Home / Self Care | Payer: Medicare Other | Attending: Emergency Medicine | Admitting: Emergency Medicine

## 2022-12-30 ENCOUNTER — Encounter (HOSPITAL_BASED_OUTPATIENT_CLINIC_OR_DEPARTMENT_OTHER): Payer: Self-pay | Admitting: Urology

## 2022-12-30 ENCOUNTER — Emergency Department (HOSPITAL_BASED_OUTPATIENT_CLINIC_OR_DEPARTMENT_OTHER): Payer: Medicare Other

## 2022-12-30 DIAGNOSIS — M25562 Pain in left knee: Secondary | ICD-10-CM | POA: Diagnosis present

## 2022-12-30 DIAGNOSIS — S8392XA Sprain of unspecified site of left knee, initial encounter: Secondary | ICD-10-CM | POA: Insufficient documentation

## 2022-12-30 DIAGNOSIS — I10 Essential (primary) hypertension: Secondary | ICD-10-CM | POA: Diagnosis not present

## 2022-12-30 DIAGNOSIS — Z7982 Long term (current) use of aspirin: Secondary | ICD-10-CM | POA: Insufficient documentation

## 2022-12-30 DIAGNOSIS — M25462 Effusion, left knee: Secondary | ICD-10-CM | POA: Insufficient documentation

## 2022-12-30 DIAGNOSIS — E119 Type 2 diabetes mellitus without complications: Secondary | ICD-10-CM | POA: Insufficient documentation

## 2022-12-30 DIAGNOSIS — Z79899 Other long term (current) drug therapy: Secondary | ICD-10-CM | POA: Diagnosis not present

## 2022-12-30 DIAGNOSIS — W1842XA Slipping, tripping and stumbling without falling due to stepping into hole or opening, initial encounter: Secondary | ICD-10-CM | POA: Insufficient documentation

## 2022-12-30 MED ORDER — IBUPROFEN 600 MG PO TABS: 600.0000 mg | ORAL_TABLET | Freq: Three times a day (TID) | ORAL | 0 refills | Status: AC | PRN

## 2022-12-30 NOTE — ED Notes (Signed)
Discharge paperwork reviewed entirely with patient, including Rx's and follow up care. Pain was under control. Pt verbalized understanding as well as all parties involved. No questions or concerns voiced at the time of discharge. No acute distress noted.   Pt ambulated out to PVA without incident or assistance.  

## 2022-12-30 NOTE — Discharge Instructions (Addendum)
Thank you for letting us take care of you today.  Your x-ray showed a small joint effusion but no fracture or dislocation. You have likely sprained your knee. With your difficulty walking, we are providing a brace to assist you. Use your home crutches as needed. I also recommend you take NSAIDs as prescribed over the next few days to help with pain and inflammation. You may take over the counter Tylenol in addition to this. If your pain continues, you should follow up with orthopedics for further assessment next week. They may want to do more testing or imaging at that time.  I recommend only using the brace as needed such as when you leave your home and need to do a lot of walking. Excessive use may cause stiffening of the knee and worsening pain. Should you start noticing improvement in your pain, I have provided some attached exercises you may try to rehabilitate your knee.  I have provided the contact information for orthopedics. Please call them to schedule a follow up appointment. Please also follow up with your PCP as needed. If you do not have a PCP, I have provided the names of 2 clinics where you may establish primary care.  For any new injury or worsening/new symptoms, please return to nearest emergency department for re-evaluation.

## 2022-12-30 NOTE — ED Triage Notes (Signed)
Left knee pain after stepping in a hole last week  Partial weight bearing noted

## 2022-12-30 NOTE — ED Provider Notes (Addendum)
[facility] Provider Note   CSN: [id number] Arrival date & time: 12/30/22  1409     History  Chief Complaint  Patient presents with   Knee Pain    Madison Tate is a 72 y.o. female with past medical history hypertension, hyperlipidemia, type 2 diabetes, TIA, anxiety who presents to the ED complaining of left knee pain after accidentally stepping in a hole last week.  Patient denies falling down when this happened and does not remember if she twisted her knee 1 way or another.  She has had pain since that time but has been able to ambulate.  She notes that yesterday secondary to her knee pain the knee gave out and she landed in a lunge position hitting the anterior left knee on the ground.  Since this time, she has had worsening pain to the knee and increased pain with weightbearing and ambulation.  She states she took a dose of Tylenol last night but did not have much relief with this.  She states that she is afraid to take any other medication because she does not want to cover up the symptoms if something is actually wrong and wants to know if she starts getting worse.  She has not previously injured this knee before.  She denies any other injury or complaint today.      Home Medications Prior to Admission medications   Medication Sig Start Date End Date Taking? Authorizing Provider  ibuprofen (ADVIL) 600 MG tablet Take 1 tablet (600 mg total) by mouth every 8 (eight) hours as needed for up to 5 days for mild pain or moderate pain. 12/30/22 01/04/23 Yes Makaiyah Schweiger L, PA-C  amLODipine (NORVASC) 5 MG tablet Take 5 mg by mouth daily.    [provider]  aspirin 325 MG tablet Take 325 mg by mouth daily.    [provider]  conjugated estrogens (PREMARIN) vaginal cream Place vaginally daily.    [provider]  doxycycline (VIBRAMYCIN) 100 MG capsule Take 1 capsule (100 mg total) by mouth 2 (two) times daily. One po bid  x 7 days 06/03/19   Malvin Johns, MD  Iron-Vitamins (GERITOL COMPLETE PO) Take by mouth.    [provider]  propantheline (PROBANTHINE) 15 MG tablet Take 15 mg by mouth 4 (four) times daily.    [provider]  propranolol (INDERAL) 20 MG tablet Take 20 mg by mouth 3 (three) times daily.    [provider]  rosuvastatin (CRESTOR) 10 MG tablet Take 10 mg by mouth daily.    [provider]      Allergies    Bactrim [sulfamethoxazole-trimethoprim], Sulfa antibiotics, Topiramate, and Toprol xl [metoprolol tartrate]    Review of Systems   Review of Systems  All other systems reviewed and are negative.   Physical Exam Updated Vital Signs BP 138/82 (BP Location: Right Arm)   Pulse 77   Temp 98 F (36.7 C) (Oral)   Resp 20   Ht 5' 6"$  (1.676 m)   Wt 89.4 kg   SpO2 97%   BMI 31.81 kg/m  Physical Exam Vitals and nursing note reviewed.  Constitutional:      General: She is not in acute distress.    Appearance: Normal appearance. She is not toxic-appearing.  HENT:     Head: Normocephalic and atraumatic.     Mouth/Throat:     Mouth: Mucous membranes are moist.  Eyes:     Conjunctiva/sclera: Conjunctivae  normal.  Cardiovascular:     Rate and Rhythm: Normal rate and regular rhythm.     Heart sounds: No murmur heard. Pulmonary:     Effort: Pulmonary effort is normal.     Breath sounds: Normal breath sounds.  Abdominal:     General: Abdomen is flat.     Palpations: Abdomen is soft.     Tenderness: There is no abdominal tenderness.  Musculoskeletal:     Cervical back: Neck supple.     Comments: Mild swelling just superior to left knee, no overlying erythema, warmth, or wounds to the left knee joint, mild tenderness to superomedial aspect of knee, no joint laxity, no posterior knee tenderness, negative anterior drawer sign, negative Lachman test, compartments soft, neurovascularly intact distally, no obvious deformity, flexion slightly decreased  secondary to pain; no tenderness, laxity, or deformity of left ankle joint, no tenderness over left femur or hip  Skin:    General: Skin is warm and dry.     Capillary Refill: Capillary refill takes less than 2 seconds.  Neurological:     Mental Status: She is alert. Mental status is at baseline.  Psychiatric:        Behavior: Behavior normal.     ED Results / Procedures / Treatments   Labs (all labs ordered are listed, but only abnormal results are displayed) Labs Reviewed - No data to display  EKG None  Radiology DG Knee Complete 4 Views Left  Result Date: 12/30/2022 CLINICAL DATA:  Status post fall with left knee pain. EXAM: LEFT KNEE - COMPLETE 4+ VIEW COMPARISON:  None Available. FINDINGS: No evidence of an acute fracture or dislocation. No evidence of arthropathy or other focal bone abnormality. A small joint effusion is noted. IMPRESSION: Small joint effusion without evidence of acute osseous abnormality. Electronically Signed   By: Virgina Norfolk M.D.   On: 12/30/2022 14:50    Procedures .Ortho Injury Treatment  Date/Time: 12/30/2022 3:30 PM  Performed by: Suzzette Righter, PA-C Authorized by: Suzzette Righter, PA-C   Consent:    Consent obtained:  Verbal   Consent given by:  Patient   Risks discussed:  Restricted joint movement, stiffness, vascular damage and nerve damage   Alternatives discussed:  No treatment, alternative treatment, referral, immobilization and delayed treatmentInjury location: knee Location details: left knee Injury type: sprain. Pre-procedure neurovascular assessment: neurovascularly intact Pre-procedure distal perfusion: normal Pre-procedure neurological function: normal Pre-procedure range of motion: reduced Immobilization: brace Splint Applied by: ED Tech Post-procedure neurovascular assessment: post-procedure neurovascularly intact Post-procedure distal perfusion: normal Post-procedure neurological function: normal Post-procedure  range of motion: unchanged       Medications Ordered in ED Medications - No data to display  ED Course/ Medical Decision Making/ A&P                             Medical Decision Making Amount and/or Complexity of Data Reviewed Radiology: ordered. Decision-making details documented in ED Course.  Risk Prescription drug management.   Medical Decision Making:   Madison Tate is a 72 y.o. female who presented to the ED today with knee pain as detailed above.    Patient's presentation is complicated by their history of age.  Complete initial physical exam performed, notably the patient was with tenderness to left superomedial aspect of knee but neurovascularly intact with soft compartments, no obvious deformity, no superior or inferior joint injury. Reviewed and confirmed nursing documentation for past medical  history, family history, social history.    Initial Assessment:   With the patient's presentation of knee pain, most likely diagnosis is sprain. Other diagnoses were considered including (but not limited to) fracture, dislocation, compartment syndrome, meniscus injury. These are considered less likely due to history of present illness and physical exam findings.   This is most consistent with an acute complicated illness  Initial Plan:  XR to evaluate for bony pathology.  Objective evaluation as reviewed   Initial Study Results:    Radiology:  All images reviewed independently. Agree with radiology report at this time.   DG Knee Complete 4 Views Left  Result Date: 12/30/2022 CLINICAL DATA:  Status post fall with left knee pain. EXAM: LEFT KNEE - COMPLETE 4+ VIEW COMPARISON:  None Available. FINDINGS: No evidence of an acute fracture or dislocation. No evidence of arthropathy or other focal bone abnormality. A small joint effusion is noted. IMPRESSION: Small joint effusion without evidence of acute osseous abnormality. Electronically Signed   By: Virgina Norfolk M.D.   On:  12/30/2022 14:50     Final Assessment and Plan:   This is a 72 year old female presenting to the ED with acute left knee pain.  Mechanism of injury as described above.  On exam, she has tenderness to the supermedial aspect of the knee with slight swelling superior to the knee joint but no joint laxity, soft compartments, no obvious deformity, and she is neurovascularly intact.  X-ray negative for acute fracture or dislocation.  Does show small joint effusion. No significant effusion on exam that appears amenable to drainage. No signs of septic joint. Discussed with pt plan to discharge with NSAIDs for pain/inflammation control, RICE therapy including knee brace and walker as needed, and orthopedic follow up for continued symptoms. Pt expressed understanding of plan. Strict ED return precautions given, all questions answered, and pt stable at time of discharge.    Clinical Impression:  1. Sprain of left knee, unspecified ligament, initial encounter   2. Effusion of left knee      Discharge      Final Clinical Impression(s) / ED Diagnoses Final diagnoses:  Sprain of left knee, unspecified ligament, initial encounter  Effusion of left knee    Rx / DC Orders ED Discharge Orders          Ordered    ibuprofen (ADVIL) 600 MG tablet  Every 8 hours PRN        12/30/22 1549              Suzzette Righter, PA-C 12/30/22 1758    Turner Daniels 12/30/22 1834    Margette Fast, MD 01/04/23 [phone]

## 2023-01-19 ENCOUNTER — Other Ambulatory Visit: Payer: Self-pay | Admitting: Orthopedic Surgery

## 2023-01-19 DIAGNOSIS — X501XXA Overexertion from prolonged static or awkward postures, initial encounter: Secondary | ICD-10-CM

## 2023-01-19 DIAGNOSIS — M2550 Pain in unspecified joint: Secondary | ICD-10-CM

## 2023-01-20 ENCOUNTER — Ambulatory Visit
Admission: RE | Admit: 2023-01-20 | Discharge: 2023-01-20 | Disposition: A | Discharge status: Home / Self Care | Payer: Medicare Other | Source: Ambulatory Visit | Attending: Orthopedic Surgery | Admitting: Orthopedic Surgery

## 2023-01-20 DIAGNOSIS — X501XXA Overexertion from prolonged static or awkward postures, initial encounter: Secondary | ICD-10-CM

## 2023-01-20 DIAGNOSIS — M2550 Pain in unspecified joint: Secondary | ICD-10-CM

## 2023-02-01 ENCOUNTER — Ambulatory Visit: Payer: Medicare Other | Attending: Orthopedic Surgery | Admitting: Physical Therapy

## 2023-02-01 ENCOUNTER — Encounter: Payer: Self-pay | Admitting: Physical Therapy

## 2023-02-01 ENCOUNTER — Other Ambulatory Visit: Payer: Self-pay

## 2023-02-01 DIAGNOSIS — M25562 Pain in left knee: Secondary | ICD-10-CM | POA: Insufficient documentation

## 2023-02-01 DIAGNOSIS — M25662 Stiffness of left knee, not elsewhere classified: Secondary | ICD-10-CM | POA: Insufficient documentation

## 2023-02-01 NOTE — Therapy (Signed)
OUTPATIENT PHYSICAL THERAPY LOWER EXTREMITY EVALUATION   Patient Name: Madison Tate MRN: [id number] DOB:Apr 28, 1951, 72 y.o., female Today's Date: 02/01/2023  END OF SESSION:  PT End of Session - 02/01/23 2025     Visit Number 1    Date for PT Re-Evaluation 03/29/23    Authorization Type Medicare A and B    Authorization Time Period 02/01/23 to 03/29/23    Progress Note Due on Visit 10    PT Start Time 1401    PT Stop Time 1445    PT Time Calculation (min) 44 min    Activity Tolerance Patient tolerated treatment well    Behavior During Therapy WFL for tasks assessed/performed             Past Medical History:  Diagnosis Date   Anxiety    Diabetes mellitus without complication ([place])    Hypercholesteremia    Hypertension    TIA (transient ischemic attack)    Past Surgical History:  Procedure Laterality Date   ABDOMINAL HYSTERECTOMY     There are no problems to display for this patient.   PCP:   REFERRING PROVIDER: Altamese [place], MD  REFERRING DIAG: Medial meniscus Lt knee   THERAPY DIAG:  Acute pain of left knee  Stiffness of left knee, not elsewhere classified  Rationale for Evaluation and Treatment: Rehabilitation  ONSET DATE: 12/18/22  SUBJECTIVE:   SUBJECTIVE STATEMENT: Pt states that she hurt her knee back around 12/18/22 when she was raking the leaves. It was getting better until her dog ran into the side of it. This increased her pain again. It is still improving but greatly limits her ability to work and do her other standing activity. She can stand maybe an hour at a time because of the pain. Was given a walker which helped. She only uses it on really "bad" days.   PERTINENT HISTORY:  DM PAIN:  Are you having pain? Yes: NPRS scale: 6/10 Pain location: Lt medial knee  Pain description: aching, but sharp if moves the knee the wrong way Aggravating factors: standing, stairs up/down, anything that involves flexing/extending the knee Relieving factors:  seated rest   PRECAUTIONS: None  WEIGHT BEARING RESTRICTIONS: No  FALLS:  Has patient fallen in last 6 months? 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   PATIENT SURVEYS:  FOTO 42, 63 goal  COGNITION: Overall cognitive status: Within functional limits for tasks assessed     SENSATION: Denies numbness/tingling   MUSCLE LENGTH:  Thomas test: Right  deg; Left 90 deg (+) pain  POSTURE: weight shift right  PALPATION: Muscle spasm Lt quad, Lt adductor, medial knee   LOWER EXTREMITY ROM:  Active ROM Right eval Left eval  Hip flexion    Hip extension    Hip abduction    Hip adduction    Hip internal rotation    Hip external rotation    Knee flexion 140 125  Knee extension 0 -5  Ankle dorsiflexion    Ankle plantarflexion    Ankle inversion    Ankle eversion     (Blank rows = not tested)  LOWER EXTREMITY MMT:  MMT  Right eval Left eval  Hip flexion 5 5  Hip extension 5 5  Hip abduction 5 5  Hip adduction    Hip internal rotation    Hip external rotation    Knee flexion    Knee extension 5 3 (+) pain  Ankle dorsiflexion    Ankle plantarflexion    Ankle inversion    Ankle eversion     (Blank rows = not tested)  LOWER EXTREMITY SPECIAL TESTS:    FUNCTIONAL TESTS:  5 times sit to stand: 14 sec, UE pushing into armrests  Timed up and go (TUG): 17 sec, slow turning  GAIT: Distance walked: Lt antalgic patter, decreased knee extension on Lt  Assistive device utilized:  none Level of assistance: Complete Independence Comments: stairs- prefers step to pattern with Rt LE leading     TODAY'S TREATMENT:                                                                                                                              DATE:  02/01/23 Manual: STM and addaday to Lt quad/adductors  Standing quad stretch with LE in chair, HEP demo    PATIENT EDUCATION:  Education details: eval findings/POC; implemented quad stretch, dry needling info Person educated: Patient Education method: Explanation and Handouts Education comprehension: verbalized understanding  HOME EXERCISE PROGRAM: Access Code: [email or web address] URL: [email or web address] Date: 02/01/2023 Prepared by: [place] with Chair and Counter Support  - 3 x daily - 7 x weekly - 1 sets - 1 reps - 30 seconds hold  ASSESSMENT:  CLINICAL IMPRESSION: Patient is a 72 y.o. F who was seen today for physical therapy evaluation and treatment for Lt knee pain onset around 12/18/22. She was improving some until her dog ran into the leg and further injured it. She had MRI which was negative for any ligamentous injury. She has had approximately 50% improvement in her pain since the onset but continues to have limited ADLs and work due to Lt knee pain and stiffness. She lacks end ranges of knee flexion and extension compared to the Rt. She has difficulty with turns and sit to stand. Pt also has significant muscle tension and spasm of the Lt quads and adductors. She would benefit from skilled PT to address her impairments to decrease pain and promote her full return to ADLs and work.   OBJECTIVE IMPAIRMENTS: Abnormal gait, decreased activity tolerance, decreased endurance, decreased mobility, difficulty walking, decreased ROM, decreased strength, hypomobility, increased muscle spasms, improper body mechanics, and pain.   ACTIVITY LIMITATIONS: sitting, standing, squatting, stairs, transfers, locomotion level, and caring for  others  PARTICIPATION LIMITATIONS: community activity, occupation, and yard work  PERSONAL FACTORS: Age and Fitness are also affecting patient's functional outcome.   REHAB POTENTIAL: Good  CLINICAL DECISION MAKING: Stable/uncomplicated  EVALUATION COMPLEXITY: Low   GOALS: Goals reviewed with patient? Yes  SHORT TERM GOALS: Target  date: 02/08/23 Pt will be independent with initial HEP to improve LE ROM and mobility.  Baseline: Goal status: INITIAL    LONG TERM GOALS: Target date: 03/29/23  Pt will be able to complete TUG in less than 14 seconds. Baseline:  Goal status: INITIAL  2.  Pt will have improved pain and function, evident by her ability to complete up to a 4 hour shift at work. Baseline:  Goal status: INITIAL  3.  Pt will report atleast 50% improvement in her knee pain from the start of PT. Baseline:  Goal status: INITIAL  4.  Pt will have improved Lt quad strength to 5/5 MMT. Baseline:  Goal status: INITIAL  5.  Pt will be able to ascend and descend 4 steps with or without handrail and step over step pattern Baseline:  Goal status: INITIAL    PLAN:  PT FREQUENCY: 1-2x/week  PT DURATION: 8 weeks  PLANNED INTERVENTIONS: Therapeutic exercises, Therapeutic activity, Neuromuscular re-education, Balance training, Gait training, Patient/Family education, Self Care, Joint mobilization, Aquatic Therapy, Dry Needling, Vasopneumatic device, Manual therapy, and Re-evaluation  PLAN FOR NEXT SESSION: d/n Lt quad/adductors; Lt knee strength and AROM progressions   8:37 PM,02/01/23 Sherol Dade PT, [place] [facility]

## 2023-02-07 ENCOUNTER — Ambulatory Visit: Payer: Medicare Other | Admitting: Physical Therapy

## 2023-02-07 ENCOUNTER — Encounter: Payer: Self-pay | Admitting: Physical Therapy

## 2023-02-07 DIAGNOSIS — M25662 Stiffness of left knee, not elsewhere classified: Secondary | ICD-10-CM

## 2023-02-07 DIAGNOSIS — M25562 Pain in left knee: Secondary | ICD-10-CM

## 2023-02-07 NOTE — Therapy (Signed)
OUTPATIENT PHYSICAL THERAPY TREATMENT NOTE   Patient Name: Madison Tate MRN: [id number] DOB:May 15, 1951, 72 y.o., female Today's Date: 02/07/2023  PCP: General REFERRING PROVIDER: Altamese [place], MD   END OF SESSION:   PT End of Session - 02/07/23 1356     Visit Number 2    Date for PT Re-Evaluation 03/29/23    Authorization Type Medicare A and B    Authorization Time Period 02/01/23 to 03/29/23    Progress Note Due on Visit 10    PT Start Time 1357    Activity Tolerance Patient tolerated treatment well    Behavior During Therapy [facility] for tasks assessed/performed             Past Medical History:  Diagnosis Date   Anxiety    Diabetes mellitus without complication ([place])    Hypercholesteremia    Hypertension    TIA (transient ischemic attack)    Past Surgical History:  Procedure Laterality Date   ABDOMINAL HYSTERECTOMY     There are no problems to display for this patient.   REFERRING DIAG: Medial meniscus Lt knee   THERAPY DIAG:  Acute pain of left knee  Stiffness of left knee, not elsewhere classified  Rationale for Evaluation and Treatment Rehabilitation  PERTINENT HISTORY: DM  PRECAUTIONS:None  SUBJECTIVE:                                                                                                                                                                                      SUBJECTIVE STATEMENT:  Did very well after the eval. The quad stretch is very difficult for me to do.    PAIN:  Are you having pain? Yes: NPRS scale: 5/10 Pain location: Lt knee Pain description: sore Aggravating factors: Being up on it. 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    PATIENT SURVEYS:  FOTO 42, 63 goal   COGNITION: Overall cognitive status: Within functional limits for tasks assessed                          SENSATION: Denies numbness/tingling     MUSCLE LENGTH:   Thomas test: Right  deg; Left 90 deg (+) pain   POSTURE: weight shift right   PALPATION: Muscle spasm Lt quad, Lt adductor, medial knee    LOWER EXTREMITY ROM:   Active ROM Right eval Left eval  Hip flexion      Hip extension      Hip abduction      Hip adduction  Hip internal rotation      Hip external rotation      Knee flexion 140 125  Knee extension 0 -5  Ankle dorsiflexion      Ankle plantarflexion      Ankle inversion      Ankle eversion       (Blank rows = not tested)   LOWER EXTREMITY MMT:   MMT Right eval Left eval  Hip flexion 5 5  Hip extension 5 5  Hip abduction 5 5  Hip adduction      Hip internal rotation      Hip external rotation      Knee flexion      Knee extension 5 3 (+) pain  Ankle dorsiflexion      Ankle plantarflexion      Ankle inversion      Ankle eversion       (Blank rows = not tested)   LOWER EXTREMITY SPECIAL TESTS:      FUNCTIONAL TESTS:  5 times sit to stand: 14 sec, UE pushing into armrests  Timed up and go (TUG): 17 sec, slow turning   GAIT: Distance walked: Lt antalgic patter, decreased knee extension on Lt  Assistive device utilized:  none Level of assistance: Complete Independence Comments: stairs- prefers step to pattern with Rt LE leading      TODAY'S TREATMENT:   02/07/23: Nustep L1 5 min with discussion of current status Manual: Addaday assist to Lt quads Prone: small towel rolled above patella, Knee AROM for flexion 10x Supine heel slides on floor slider 2x10 Sit to stand on mat table with 2 cushions: no pain 2 x10 Supine hip add/abd AROM with strap 20x LAQ 2x10 more AROM A few minutes MHP to LT knee post TE, pt reports heat helps.                                                                                                                              DATE:  02/01/23 Manual: STM and addaday to Lt quad/adductors   Standing quad stretch with LE in chair, HEP demo       PATIENT EDUCATION:  Education details: eval findings/POC; implemented quad stretch, dry needling info Person educated: Patient Education method: Explanation and Handouts Education comprehension: verbalized understanding   HOME EXERCISE PROGRAM: Access Code: [email or web address] URL: [email or web address] Date: 02/01/2023 Prepared by: [facility]   Exercises - Quadricep Stretch with Chair and Counter Support  - 3 x daily - 7 x weekly - 1 sets - 1 reps - 30 seconds hold   ASSESSMENT:   CLINICAL IMPRESSION:Pt arrives with moderate knee pain. She reports doing well after her eval but the quad stretch is hard to do at home. Pt was able to lay prone with towel under distal quad to unweight patella and perform knee flexion without pain. Pt reports the soft tissue mobilization is very helpful in decreasing pain. Pt  reports she experienced an hour pain free standing at work last week. Knee was a little sore after TE but the "quad continues to feel better."    OBJECTIVE IMPAIRMENTS: Abnormal gait, decreased activity tolerance, decreased endurance, decreased mobility, difficulty walking, decreased ROM, decreased strength, hypomobility, increased muscle spasms, improper body mechanics, and pain.    ACTIVITY LIMITATIONS: sitting, standing, squatting, stairs, transfers, locomotion level, and caring for others   PARTICIPATION LIMITATIONS: community activity, occupation, and yard work   PERSONAL FACTORS: Age and Fitness are also affecting patient's functional outcome.    REHAB POTENTIAL: Good   CLINICAL DECISION MAKING: Stable/uncomplicated   EVALUATION COMPLEXITY: Low     GOALS: Goals reviewed with patient? Yes   SHORT TERM GOALS: Target date: 02/08/23 Pt will be independent with initial HEP to improve LE ROM and mobility.  Baseline: Goal status: INITIAL       LONG TERM GOALS:  Target date: 03/29/23   Pt will be able to complete TUG in less than 14 seconds. Baseline:  Goal status: INITIAL   2.  Pt will have improved pain and function, evident by her ability to complete up to a 4 hour shift at work. Baseline:  Goal status: INITIAL   3.  Pt will report atleast 50% improvement in her knee pain from the start of PT. Baseline:  Goal status: INITIAL   4.  Pt will have improved Lt quad strength to 5/5 MMT. Baseline:  Goal status: INITIAL   5.  Pt will be able to ascend and descend 4 steps with or without handrail and step over step pattern Baseline:  Goal status: INITIAL       PLAN:   PT FREQUENCY: 1-2x/week   PT DURATION: 8 weeks   PLANNED INTERVENTIONS: Therapeutic exercises, Therapeutic activity, Neuromuscular re-education, Balance training, Gait training, Patient/Family education, Self Care, Joint mobilization, Aquatic Therapy, Dry Needling, Vasopneumatic device, Manual therapy, and Re-evaluation   PLAN FOR NEXT SESSION: d/n Lt quad/adductors; Lt knee strength and AROM progressions        Lonisha Bobby, PTA 02/07/2023, 1:57 PM

## 2023-02-18 ENCOUNTER — Ambulatory Visit: Payer: Medicare Other | Attending: Orthopedic Surgery | Admitting: Physical Therapy

## 2023-02-18 ENCOUNTER — Encounter: Payer: Self-pay | Admitting: Physical Therapy

## 2023-02-18 DIAGNOSIS — M25562 Pain in left knee: Secondary | ICD-10-CM | POA: Insufficient documentation

## 2023-02-18 DIAGNOSIS — M25662 Stiffness of left knee, not elsewhere classified: Secondary | ICD-10-CM | POA: Insufficient documentation

## 2023-02-18 NOTE — Therapy (Signed)
OUTPATIENT PHYSICAL THERAPY TREATMENT NOTE   Patient Name: Madison JustinLinda S Damaso MRN: [id number] DOB:06/14/1951, 72 y.o., female Today's Date: 02/18/2023  PCP: General REFERRING PROVIDER: Myrene GalasMichael Handy, MD   END OF SESSION:   PT End of Session - 02/18/23 1125     Visit Number 3    Date for PT Re-Evaluation 03/29/23    Authorization Type Medicare A and B    Authorization Time Period 02/01/23 to 03/29/23    Progress Note Due on Visit 10    PT Start Time 1017    PT Stop Time 1100    PT Time Calculation (min) 43 min    Activity Tolerance Patient tolerated treatment well;No increased pain    Behavior During Therapy WFL for tasks assessed/performed              Past Medical History:  Diagnosis Date   Anxiety    Diabetes mellitus without complication    Hypercholesteremia    Hypertension    TIA (transient ischemic attack)    Past Surgical History:  Procedure Laterality Date   ABDOMINAL HYSTERECTOMY     There are no problems to display for this patient.   REFERRING DIAG: Medial meniscus Lt knee   THERAPY DIAG:  Acute pain of left knee  Stiffness of left knee, not elsewhere classified  Rationale for Evaluation and Treatment Rehabilitation  PERTINENT HISTORY: DM  PRECAUTIONS:None  SUBJECTIVE:                                                                                                                                                                                      SUBJECTIVE STATEMENT:  Pt states that things are good in the mornings, but sometimes it takes nothing to send the pain levels up. Currently, she is having mild discomfort in the knee.    PAIN:  Are you having pain? Yes: NPRS scale: 3/10 Pain location: Lt knee Pain description: sore Aggravating factors: Being up on it. messing Relieving factors: Not messing with it   OBJECTIVE: (objective measures completed at initial evaluation unless otherwise dated) DIAGNOSTIC FINDINGS: MRI: Mild-to-moderate  tricompartment osteoarthritis    PATIENT SURVEYS:  FOTO 42, 63 goal   COGNITION: Overall cognitive status: Within functional limits for tasks assessed                         SENSATION: Denies numbness/tingling     MUSCLE LENGTH:   Thomas test: Right  deg; Left 90 deg (+) pain   POSTURE: weight shift right   PALPATION: Muscle spasm Lt quad, Lt adductor, medial knee    LOWER EXTREMITY ROM:   Active ROM  Right eval Left eval  Hip flexion      Hip extension      Hip abduction      Hip adduction      Hip internal rotation      Hip external rotation      Knee flexion 140 125  Knee extension 0 -5  Ankle dorsiflexion      Ankle plantarflexion      Ankle inversion      Ankle eversion       (Blank rows = not tested)   LOWER EXTREMITY MMT:   MMT Right eval Left eval  Hip flexion 5 5  Hip extension 5 5  Hip abduction 5 5  Hip adduction      Hip internal rotation      Hip external rotation      Knee flexion      Knee extension 5 3 (+) pain  Ankle dorsiflexion      Ankle plantarflexion      Ankle inversion      Ankle eversion       (Blank rows = not tested)   LOWER EXTREMITY SPECIAL TESTS:      FUNCTIONAL TESTS:  5 times sit to stand: 14 sec, UE pushing into armrests  Timed up and go (TUG): 17 sec, slow turning   GAIT: Distance walked: Lt antalgic patter, decreased knee extension on Lt  Assistive device utilized:  none Level of assistance: Complete Independence Comments: stairs- prefers step to pattern with Rt LE leading      TODAY'S TREATMENT:   02/18/23: Nustep L1 x6 min PT discussing updates to progress Supine Lt quad stretch 10x10 sec hold  Supine Lt adductor butterfly stretch 2x20 sec  Standing Lt adductor stretch at counter x20 sec  Seated Lt LAQ with #2.5 x15 reps, PT cuing for terminal knee extension  Standing Lt TKE with yellow TB HEP demo Trigger Point Dry-Needling  Treatment instructions: Expect mild to moderate muscle soreness. S/S of  pneumothorax if dry needled over a lung field, and to seek immediate medical attention should they occur. Patient verbalized understanding of these instructions and education.  Patient Consent Given: Yes Education handout provided: Yes previous session Muscles treated: Lt quadriceps I Electrical stimulation performed: No Parameters: N/A Treatment response/outcome: improved soft tissue mobility  Manual: STM Lt quadriceps     02/07/23: Nustep L1 5 min with discussion of current status Manual: Addaday assist to Lt quads Prone: small towel rolled above patella, Knee AROM for flexion 10x Supine heel slides on floor slider 2x10 Sit to stand on mat table with 2 cushions: no pain 2 x10 Supine hip add/abd AROM with strap 20x LAQ 2x10 more AROM A few minutes MHP to LT knee post TE, pt reports heat helps.  PATIENT EDUCATION:  Education details: dry needling aftercare; updates to HEP Person educated: Patient Education method: Chief Technology Officer Education comprehension: verbalized understanding   HOME EXERCISE PROGRAM: Access Code: [email or web address] URL: [email or web address] Date: 02/01/2023 Prepared by: [facility]   Exercises - Quadricep Stretch with Chair and Counter Support  - 3 x daily - 7 x weekly - 1 sets - 1 reps - 30 seconds hold   ASSESSMENT:   CLINICAL IMPRESSION:   Pt is having on and off pain in the Lt knee, especially with standing at work. She had mild pain upon arrival but was able to complete all exercises without increase in pain. Pt was agreeable to dry needling and there were several twitch responses noted in the Lt quadriceps. Pt reported no soreness at the end of today's session.    OBJECTIVE IMPAIRMENTS: Abnormal gait, decreased activity tolerance, decreased endurance, decreased  mobility, difficulty walking, decreased ROM, decreased strength, hypomobility, increased muscle spasms, improper body mechanics, and pain.    ACTIVITY LIMITATIONS: sitting, standing, squatting, stairs, transfers, locomotion level, and caring for others   PARTICIPATION LIMITATIONS: community activity, occupation, and yard work   PERSONAL FACTORS: Age and Fitness are also affecting patient's functional outcome.    REHAB POTENTIAL: Good   CLINICAL DECISION MAKING: Stable/uncomplicated   EVALUATION COMPLEXITY: Low     GOALS: Goals reviewed with patient? Yes   SHORT TERM GOALS: Target date: 02/08/23 Pt will be independent with initial HEP to improve LE ROM and mobility.  Baseline: Goal status: INITIAL       LONG TERM GOALS: Target date: 03/29/23   Pt will be able to complete TUG in less than 14 seconds. Baseline:  Goal status: INITIAL   2.  Pt will have improved pain and function, evident by her ability to complete up to a 4 hour shift at work. Baseline:  Goal status: INITIAL   3.  Pt will report atleast 50% improvement in her knee pain from the start of PT. Baseline:  Goal status: INITIAL   4.  Pt will have improved Lt quad strength to 5/5 MMT. Baseline:  Goal status: INITIAL   5.  Pt will be able to ascend and descend 4 steps with or without handrail and step over step pattern Baseline:  Goal status: INITIAL       PLAN:   PT FREQUENCY: 1-2x/week   PT DURATION: 8 weeks   PLANNED INTERVENTIONS: Therapeutic exercises, Therapeutic activity, Neuromuscular re-education, Balance training, Gait training, Patient/Family education, Self Care, Joint mobilization, Aquatic Therapy, Dry Needling, Vasopneumatic device, Manual therapy, and Re-evaluation   PLAN FOR NEXT SESSION: d/n Lt quad/adductors as needed; recheck ROM and progress closed chain strength of Lt LE       11:59 AM,02/18/23 Donita Brooks PT, DPT [facility] [place]   [phone]

## 2023-02-25 ENCOUNTER — Ambulatory Visit: Payer: Medicare Other | Admitting: Physical Therapy

## 2023-02-25 DIAGNOSIS — M25562 Pain in left knee: Secondary | ICD-10-CM

## 2023-02-25 DIAGNOSIS — M25662 Stiffness of left knee, not elsewhere classified: Secondary | ICD-10-CM

## 2023-02-25 NOTE — Therapy (Signed)
OUTPATIENT PHYSICAL THERAPY TREATMENT NOTE   Patient Name: Madison Tate MRN: [id number] DOB:27-Apr-1951, 72 y.o., female Today's Date: 02/25/2023  PCP: General REFERRING PROVIDER: Myrene Galas, MD   END OF SESSION:   PT End of Session - 02/25/23 0934     Visit Number 4    Date for PT Re-Evaluation 03/29/23    Authorization Type Medicare A and B    Authorization Time Period 02/01/23 to 03/29/23    Progress Note Due on Visit 10    PT Start Time 0933    PT Stop Time 1015    PT Time Calculation (min) 42 min    Activity Tolerance Patient tolerated treatment well;No increased pain    Behavior During Therapy WFL for tasks assessed/performed               Past Medical History:  Diagnosis Date   Anxiety    Diabetes mellitus without complication    Hypercholesteremia    Hypertension    TIA (transient ischemic attack)    Past Surgical History:  Procedure Laterality Date   ABDOMINAL HYSTERECTOMY     There are no problems to display for this patient.   REFERRING DIAG: Medial meniscus Lt knee   THERAPY DIAG:  Acute pain of left knee  Stiffness of left knee, not elsewhere classified  Rationale for Evaluation and Treatment Rehabilitation  PERTINENT HISTORY: DM  PRECAUTIONS:None  SUBJECTIVE:                                                                                                                                                                                      SUBJECTIVE STATEMENT:  Pt states that things are going well. She says her pain has been at about a 3/10 without any high levels of pain. She is pleased with this.    PAIN:  Are you having pain? Yes: NPRS scale: 3/10 Pain location: Lt knee Pain description: sore Aggravating factors: Being up on it. messing Relieving factors: Not messing with it   OBJECTIVE: (objective measures completed at initial evaluation unless otherwise dated) DIAGNOSTIC FINDINGS: MRI: Mild-to-moderate tricompartment  osteoarthritis    PATIENT SURVEYS:  FOTO 42, 63 goal   COGNITION: Overall cognitive status: Within functional limits for tasks assessed                         SENSATION: Denies numbness/tingling     MUSCLE LENGTH:   Thomas test: Right  deg; Left 90 deg (+) pain   POSTURE: weight shift right   PALPATION: Muscle spasm Lt quad, Lt adductor, medial knee    LOWER EXTREMITY ROM:   Active ROM  Right eval Left eval  Hip flexion      Hip extension      Hip abduction      Hip adduction      Hip internal rotation      Hip external rotation      Knee flexion 140 125  Knee extension 0 -5  Ankle dorsiflexion      Ankle plantarflexion      Ankle inversion      Ankle eversion       (Blank rows = not tested) 02/25/23: Lt knee flexion: 130 deg, extension lacking 5 deg  LOWER EXTREMITY MMT:   MMT Right eval Left eval  Hip flexion 5 5  Hip extension 5 5  Hip abduction 5 5  Hip adduction      Hip internal rotation      Hip external rotation      Knee flexion      Knee extension 5 3 (+) pain  Ankle dorsiflexion      Ankle plantarflexion      Ankle inversion      Ankle eversion       (Blank rows = not tested)   LOWER EXTREMITY SPECIAL TESTS:      FUNCTIONAL TESTS:  5 times sit to stand: 14 sec, UE pushing into armrests  Timed up and go (TUG): 17 sec, slow turning   GAIT: Distance walked: Lt antalgic patter, decreased knee extension on Lt  Assistive device utilized:  none Level of assistance: Complete Independence Comments: stairs- prefers step to pattern with Rt LE leading      TODAY'S TREATMENT:   02/25/23: Nustep L1 x5 min PT present to  Standing hamstring stretch Lt only x20 sec hold  Standing Lt steps up onto 6" step (+) pain anterior knee, x10 reps Step up Lt 4" x10 reps Manual:  Lt knee extension mobilization: AP tibiofibular mobilization grade III-IV x3 bouts Lt knee closed chain flexion MWM on 6' step, pt lunge forward 2x10 reps without pain STM and  rolling stick to Lt quads   02/18/23: Nustep L1 x6 min PT discussing updates to progress Supine Lt quad stretch 10x10 sec hold  Supine Lt adductor butterfly stretch 2x20 sec  Standing Lt adductor stretch at counter x20 sec  Seated Lt LAQ with #2.5 x15 reps, PT cuing for terminal knee extension  Standing Lt TKE with yellow TB HEP demo Trigger Point Dry-Needling  Treatment instructions: Expect mild to moderate muscle soreness. S/S of pneumothorax if dry needled over a lung field, and to seek immediate medical attention should they occur. Patient verbalized understanding of these instructions and education.  Patient Consent Given: Yes Education handout provided: Yes previous session Muscles treated: Lt quadriceps I Electrical stimulation performed: No Parameters: N/A Treatment response/outcome: improved soft tissue mobility  Manual: STM Lt quadriceps     02/07/23: Nustep L1 5 min with discussion of current status Manual: Addaday assist to Lt quads Prone: small towel rolled above patella, Knee AROM for flexion 10x Supine heel slides on floor slider 2x10 Sit to stand on mat table with 2 cushions: no pain 2 x10 Supine hip add/abd AROM with strap 20x LAQ 2x10 more AROM A few minutes MHP to LT knee post TE, pt reports heat helps.  PATIENT EDUCATION:  Education details: dry needling aftercare; updates to HEP Person educated: Patient Education method: Chief Technology Officer Education comprehension: verbalized understanding   HOME EXERCISE PROGRAM: Access Code: [email or web address] URL: [email or web address] Date: 02/25/2023 Prepared by: [facility]  Exercises - Supine Quadriceps Stretch with Strap on Table  - 2 x daily - 7 x weekly - 10 reps - 10 seconds hold - Side Lunge Adductor Stretch  - 2 x daily -  7 x weekly - 2 sets - 30 seconds hold - Standing Terminal Knee Extension with Resistance  - 1 x daily - 7 x weekly - 2 sets - 10 reps - Seated Knee Extension Stretch with Chair  - 2 x daily - 7 x weekly - 2-3 minutes hold   ASSESSMENT:   CLINICAL IMPRESSION: Pt is making steady progress towards her goals. She has improved Lt knee flexion to WNL. She still lacks terminal knee extension, so PT spent a portion of today's visit completing manual treatment to the Lt knee to increase this. Pt was able to perform step up onto 4" box, but 6" increased lateral knee pain. This pain was resolved following mobilization with movement, but pt then noted anterior knee pain. This is likely secondary to Lt quad weakness with greater step height. Pt was able to ascend and descend steps with step over pattern and pain, greatly improved from her evaluation. OBJECTIVE IMPAIRMENTS: Abnormal gait, decreased activity tolerance, decreased endurance, decreased mobility, difficulty walking, decreased ROM, decreased strength, hypomobility, increased muscle spasms, improper body mechanics, and pain.    ACTIVITY LIMITATIONS: sitting, standing, squatting, stairs, transfers, locomotion level, and caring for others   PARTICIPATION LIMITATIONS: community activity, occupation, and yard work   PERSONAL FACTORS: Age and Fitness are also affecting patient's functional outcome.    REHAB POTENTIAL: Good   CLINICAL DECISION MAKING: Stable/uncomplicated   EVALUATION COMPLEXITY: Low     GOALS: Goals reviewed with patient? Yes   SHORT TERM GOALS: Target date: 02/08/23 Pt will be independent with initial HEP to improve LE ROM and mobility.  Baseline: Goal status: MET       LONG TERM GOALS: Target date: 03/29/23   Pt will be able to complete TUG in less than 14 seconds. Baseline:  Goal status: INITIAL   2.  Pt will have improved pain and function, evident by her ability to complete up to a 4 hour shift at work. Baseline:   Goal status: INITIAL   3.  Pt will report atleast 50% improvement in her knee pain from the start of PT. Baseline:  Goal status: INITIAL   4.  Pt will have improved Lt quad strength to 5/5 MMT. Baseline:  Goal status: INITIAL   5.  Pt will be able to ascend and descend 4 steps with or without handrail and step over step pattern Baseline:  Goal status: MET       PLAN:   PT FREQUENCY: 1-2x/week   PT DURATION: 8 weeks   PLANNED INTERVENTIONS: Therapeutic exercises, Therapeutic activity, Neuromuscular re-education, Balance training, Gait training, Patient/Family education, Self Care, Joint mobilization, Aquatic Therapy, Dry Needling, Vasopneumatic device, Manual therapy, and Re-evaluation   PLAN FOR NEXT SESSION: d/n Lt proximal quad as needed; progress Lt LE strength; leg press, step downs, etc for quad strength in deeper ranges of knee flexion      10:25 AM,02/25/23 Donita Brooks PT, DPT [facility] [place]  [phone]

## 2023-03-04 ENCOUNTER — Ambulatory Visit: Payer: Medicare Other | Admitting: Physical Therapy

## 2023-03-04 ENCOUNTER — Encounter: Payer: Self-pay | Admitting: Physical Therapy

## 2023-03-04 DIAGNOSIS — M25662 Stiffness of left knee, not elsewhere classified: Secondary | ICD-10-CM

## 2023-03-04 DIAGNOSIS — M25562 Pain in left knee: Secondary | ICD-10-CM

## 2023-03-04 NOTE — Therapy (Addendum)
OUTPATIENT PHYSICAL THERAPY TREATMENT NOTE   Patient Name: Madison Tate MRN: [id number] DOB:1951-04-23, 72 y.o., female Today's Date: 03/04/2023  PCP: General REFERRING PROVIDER: Myrene Galas, MD   END OF SESSION:   PT End of Session - 03/04/23 0937     Visit Number 5    Date for PT Re-Evaluation 03/29/23    Authorization Type Medicare A and B    Authorization Time Period 02/01/23 to 03/29/23    Progress Note Due on Visit 10    PT Start Time 0934    PT Stop Time 1015    PT Time Calculation (min) 41 min    Activity Tolerance Patient tolerated treatment well;No increased pain    Behavior During Therapy WFL for tasks assessed/performed                Past Medical History:  Diagnosis Date   Anxiety    Diabetes mellitus without complication    Hypercholesteremia    Hypertension    TIA (transient ischemic attack)    Past Surgical History:  Procedure Laterality Date   ABDOMINAL HYSTERECTOMY     There are no problems to display for this patient.   REFERRING DIAG: Medial meniscus Lt knee   THERAPY DIAG:  Acute pain of left knee  Stiffness of left knee, not elsewhere classified  Rationale for Evaluation and Treatment Rehabilitation  PERTINENT HISTORY: DM  PRECAUTIONS:None  SUBJECTIVE:                                                                                                                                                                                      SUBJECTIVE STATEMENT:  Pt states that she had minimal issues over the weekend, but on Wednesday her knee really started to bother her. Pain went up to 8/10. Yesterday and today is improved some. No issues with HEP.   PAIN:  Are you having pain? Yes: NPRS scale: 5 or 6/10 Pain location: Lt knee Pain description: sore Aggravating factors: Being up on it. 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    PATIENT SURVEYS:  FOTO 42, 63 goal   COGNITION: Overall cognitive status: Within functional limits for tasks assessed                         SENSATION: Denies numbness/tingling     MUSCLE LENGTH:   Thomas test: Right  deg; Left 90 deg (+) pain   POSTURE: weight shift right   PALPATION: Muscle spasm Lt quad, Lt adductor, medial knee  LOWER EXTREMITY ROM:   Active ROM Right eval Left eval  Hip flexion      Hip extension      Hip abduction      Hip adduction      Hip internal rotation      Hip external rotation      Knee flexion 140 125  Knee extension 0 -5  Ankle dorsiflexion      Ankle plantarflexion      Ankle inversion      Ankle eversion       (Blank rows = not tested) 02/25/23: Lt knee flexion: 130 deg, extension lacking 5 deg  LOWER EXTREMITY MMT:   MMT Right eval Left eval  Hip flexion 5 5  Hip extension 5 5  Hip abduction 5 5  Hip adduction      Hip internal rotation      Hip external rotation      Knee flexion      Knee extension 5 3 (+) pain  Ankle dorsiflexion      Ankle plantarflexion      Ankle inversion      Ankle eversion       (Blank rows = not tested)   LOWER EXTREMITY SPECIAL TESTS:      FUNCTIONAL TESTS:  5 times sit to stand: 14 sec, UE pushing into armrests  Timed up and go (TUG): 17 sec, slow turning   GAIT: Distance walked: Lt antalgic patter, decreased knee extension on Lt  Assistive device utilized:  none Level of assistance: Complete Independence Comments: stairs- prefers step to pattern with Rt LE leading      TODAY'S TREATMENT:   03/04/23 Bike x5 min PT present to discuss knee pain at work Standing hip adductor slide with yellow TB x10 reps each side Seated Lt quad isometric at varied degrees of flexion up to 90 deg for 5 sec, 10x Trigger Point Dry-Needling  Treatment instructions: Expect mild to moderate muscle soreness. S/S of pneumothorax if  dry needled over a lung field, and to seek immediate medical attention should they occur. Patient verbalized understanding of these instructions and education.   Patient Consent Given: Yes Education handout provided: Yes Muscles treated: Lt adductors, quads Electrical stimulation performed: No/Yes Parameters: N/A Treatment response/outcome: improved soft tissue mobility Manual: STM Lt adductors and quads   02/25/23: Nustep L1 x5 min PT present to  Standing hamstring stretch Lt only x20 sec hold  Standing Lt steps up onto 6" step (+) pain anterior knee, x10 reps Step up Lt 4" x10 reps Manual:  Lt knee extension mobilization: AP tibiofibular mobilization grade III-IV x3 bouts Lt knee closed chain flexion MWM on 6' step, pt lunge forward 2x10 reps without pain STM and rolling stick to Lt quads   02/18/23: Nustep L1 x6 min PT discussing updates to progress Supine Lt quad stretch 10x10 sec hold  Supine Lt adductor butterfly stretch 2x20 sec  Standing Lt adductor stretch at counter x20 sec  Seated Lt LAQ with #2.5 x15 reps, PT cuing for terminal knee extension  Standing Lt TKE with yellow TB HEP demo Trigger Point Dry-Needling  Treatment instructions: Expect mild to moderate muscle soreness. S/S of pneumothorax if dry needled over a lung field, and to seek immediate medical attention should they occur. Patient verbalized understanding of these instructions and education.  Patient Consent Given: Yes Education handout provided: Yes previous session Muscles treated: Lt quadriceps I Electrical stimulation performed: No Parameters: N/A Treatment response/outcome: improved soft tissue mobility  Manual: STM Lt quadriceps     02/07/23: Nustep L1 5 min with discussion of current status Manual: Addaday assist to Lt quads Prone: small towel rolled above patella, Knee AROM for flexion 10x Supine heel slides on floor slider 2x10 Sit to stand on mat table with 2 cushions: no pain 2 x10 Supine  hip add/abd AROM with strap 20x LAQ 2x10 more AROM A few minutes MHP to LT knee post TE, pt reports heat helps.                                                                                                                                     PATIENT EDUCATION:  Education details: dry needling aftercare; adductor stretch at work Person educated: Patient Education method: [employer] Education comprehension: verbalized understanding   HOME EXERCISE PROGRAM: Access Code: [email or web address] URL: [email or web address] Date: 02/25/2023 Prepared by: [facility]  Exercises - Supine Quadriceps Stretch with Strap on Table  - 2 x daily - 7 x weekly - 10 reps - 10 seconds hold - Side Lunge Adductor Stretch  - 2 x daily - 7 x weekly - 2 sets - 30 seconds hold - Standing Terminal Knee Extension with Resistance  - 1 x daily - 7 x weekly - 2 sets - 10 reps - Seated Knee Extension Stretch with Chair  - 2 x daily - 7 x weekly - 2-3 minutes hold   ASSESSMENT:   CLINICAL IMPRESSION: Pt had several consecutive days of minimal knee pain following her last session, but Wednesday at work she had a flare-up. It has improved since then, but still some discomfort today. Worked on quad isometrics in various ranges without pain. She has palpable trigger points in the Lt quads and adductors and was agreeable to dry needling this visit. Several twitches were elicited with this. Ended with soft tissue mobilization to decrease fascial restrictions. Pt denied increase in pain end of session.   OBJECTIVE IMPAIRMENTS: Abnormal gait, decreased activity tolerance, decreased endurance, decreased mobility, difficulty walking, decreased ROM, decreased strength, hypomobility, increased muscle spasms, improper body mechanics, and pain.    ACTIVITY LIMITATIONS: sitting, standing, squatting, stairs, transfers, locomotion level, and caring for others    PARTICIPATION LIMITATIONS: community activity, occupation, and yard work   PERSONAL FACTORS: Age and Fitness are also affecting patient's functional outcome.    REHAB POTENTIAL: Good   CLINICAL DECISION MAKING: Stable/uncomplicated   EVALUATION COMPLEXITY: Low     GOALS: Goals reviewed with patient? Yes   SHORT TERM GOALS: Target date: 02/08/23 Pt will be independent with initial HEP to improve LE ROM and mobility.  Baseline: Goal status: MET       LONG TERM GOALS: Target date: 03/29/23   Pt will be able to complete TUG in less than 14 seconds. Baseline:  Goal status: INITIAL   2.  Pt will have improved pain and function,  evident by her ability to complete up to a 4 hour shift at work. Baseline:  Goal status: INITIAL   3.  Pt will report atleast 50% improvement in her knee pain from the start of PT. Baseline:  Goal status: INITIAL   4.  Pt will have improved Lt quad strength to 5/5 MMT. Baseline:  Goal status: INITIAL   5.  Pt will be able to ascend and descend 4 steps with or without handrail and step over step pattern Baseline:  Goal status: MET       PLAN:   PT FREQUENCY: 1-2x/week   PT DURATION: 8 weeks   PLANNED INTERVENTIONS: Therapeutic exercises, Therapeutic activity, Neuromuscular re-education, Balance training, Gait training, Patient/Family education, Self Care, Joint mobilization, Aquatic Therapy, Dry Needling, Vasopneumatic device, Manual therapy, and Re-evaluation   PLAN FOR NEXT SESSION: f/u on stretching while at work; progress LE strength     10:42 AM,03/04/23 Donita Brooks PT, DPT [facility] [place]  [phone]

## 2023-03-11 ENCOUNTER — Encounter: Payer: Self-pay | Admitting: Physical Therapy

## 2023-03-11 ENCOUNTER — Ambulatory Visit: Payer: Medicare Other | Admitting: Physical Therapy

## 2023-03-11 DIAGNOSIS — M25562 Pain in left knee: Secondary | ICD-10-CM

## 2023-03-11 DIAGNOSIS — M25662 Stiffness of left knee, not elsewhere classified: Secondary | ICD-10-CM

## 2023-03-11 NOTE — Therapy (Signed)
OUTPATIENT PHYSICAL THERAPY TREATMENT NOTE   Patient Name: Madison Tate MRN: [id number] DOB:Mar 28, 1951, 72 y.o., female Today's Date: 03/11/2023  PCP: General REFERRING PROVIDER: Myrene Galas, MD   END OF SESSION:   PT End of Session - 03/11/23 1108     Visit Number 6    Date for PT Re-Evaluation 03/29/23    Authorization Type Medicare A and B    Authorization Time Period 02/01/23 to 03/29/23    Progress Note Due on Visit 10    PT Start Time 1050    PT Stop Time 1146    PT Time Calculation (min) 56 min    Activity Tolerance Patient tolerated treatment well;No increased pain    Behavior During Therapy WFL for tasks assessed/performed                 Past Medical History:  Diagnosis Date   Anxiety    Diabetes mellitus without complication (HCC)    Hypercholesteremia    Hypertension    TIA (transient ischemic attack)    Past Surgical History:  Procedure Laterality Date   ABDOMINAL HYSTERECTOMY     There are no problems to display for this patient.   REFERRING DIAG: Medial meniscus Lt knee   THERAPY DIAG:  Acute pain of left knee  Stiffness of left knee, not elsewhere classified  Rationale for Evaluation and Treatment Rehabilitation  PERTINENT HISTORY: DM  PRECAUTIONS:None  SUBJECTIVE:                                                                                                                                                                                      SUBJECTIVE STATEMENT:  Pt states that her Lt knee was sore after the needling for a couple of days. Still pain with stairs. Currently 4/10.   PAIN:  Are you having pain? 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    PATIENT  SURVEYS:  FOTO 42, 63 goal   COGNITION: Overall cognitive status: Within functional limits for tasks assessed                         SENSATION: Denies numbness/tingling     MUSCLE LENGTH:   Thomas test: Right  deg; Left 90 deg (+) pain   POSTURE: weight shift right   PALPATION: Muscle spasm Lt quad, Lt adductor, medial knee    LOWER EXTREMITY ROM:   Active ROM Right eval Left eval  Hip flexion      Hip extension      Hip abduction      Hip adduction      Hip internal rotation      Hip external rotation      Knee flexion 140 125  Knee extension 0 -5  Ankle dorsiflexion      Ankle plantarflexion      Ankle inversion      Ankle eversion       (Blank rows = not tested) 02/25/23: Lt knee flexion: 130 deg, extension lacking 5 deg  LOWER EXTREMITY MMT:   MMT Right eval Left eval  Hip flexion 5 5  Hip extension 5 5  Hip abduction 5 5  Hip adduction      Hip internal rotation      Hip external rotation      Knee flexion      Knee extension 5 3 (+) pain  Ankle dorsiflexion      Ankle plantarflexion      Ankle inversion      Ankle eversion       (Blank rows = not tested)   LOWER EXTREMITY SPECIAL TESTS:      FUNCTIONAL TESTS:  5 times sit to stand: 14 sec, UE pushing into armrests  Timed up and go (TUG): 17 sec, slow turning   GAIT: Distance walked: Lt antalgic patter, decreased knee extension on Lt  Assistive device utilized:  none Level of assistance: Complete Independence Comments: stairs- prefers step to pattern with Rt LE leading      TODAY'S TREATMENT:  03/11/23 TUG 11sec Steps x2 trials reciprocal pattern (+) pain Leg press single leg #35 2x10 reps each  Standing knee flexion #2 2x10 reps each Standing adductor stretch 2x20 sec Lt Standing hamstring stretch in chair 2x20 sec  Seated LAQ #2 x15 reps Supine SAQ #2 2x15 reps Manual: Lt Knee terminal extension mobilization in supine and prone, grade III-IV x3 bouts each  position  03/04/23 Bike x5 min PT present to discuss knee pain at work Standing hip adductor slide with yellow TB x10 reps each side Seated Lt quad isometric at varied degrees of flexion up to 90 deg for 5 sec, 10x Trigger Point Dry-Needling  Treatment instructions: Expect mild to moderate muscle soreness. S/S of pneumothorax if dry needled over a lung field, and to seek immediate medical attention should they occur. Patient verbalized understanding of these instructions and education.   Patient Consent Given: Yes Education handout provided: Yes Muscles treated: Lt adductors, quads Electrical stimulation performed: No/Yes Parameters: N/A Treatment response/outcome: improved soft tissue mobility Manual: STM Lt adductors and quads   02/25/23: Nustep L1 x5 min PT present to  Standing hamstring stretch Lt only x20 sec hold  Standing Lt steps up onto 6" step (+) pain anterior knee, x10 reps Step up Lt 4" x10 reps Manual:  Lt knee extension mobilization: AP tibiofibular mobilization grade III-IV x3 bouts Lt knee closed chain flexion MWM on 6' step, pt lunge forward 2x10 reps without pain STM and rolling stick to Lt quads   02/18/23: Nustep L1 x6 min PT discussing updates to progress Supine Lt quad stretch 10x10 sec hold  Supine Lt adductor butterfly stretch 2x20 sec  Standing Lt adductor stretch at counter x20 sec  Seated Lt LAQ with #2.5 x15 reps, PT cuing for terminal knee extension  Standing Lt TKE with yellow TB HEP demo Trigger Point Dry-Needling  Treatment instructions: Expect mild to moderate muscle soreness. S/S  of pneumothorax if dry needled over a lung field, and to seek immediate medical attention should they occur. Patient verbalized understanding of these instructions and education.  Patient Consent Given: Yes Education handout provided: Yes previous session Muscles treated: Lt quadriceps I Electrical stimulation performed: No Parameters: N/A Treatment response/outcome:  improved soft tissue mobility  Manual: STM Lt quadriceps     02/07/23: Nustep L1 5 min with discussion of current status Manual: Addaday assist to Lt quads Prone: small towel rolled above patella, Knee AROM for flexion 10x Supine heel slides on floor slider 2x10 Sit to stand on mat table with 2 cushions: no pain 2 x10 Supine hip add/abd AROM with strap 20x LAQ 2x10 more AROM A few minutes MHP to LT knee post TE, pt reports heat helps.                                                                                                                                     PATIENT EDUCATION:  Education details: dry needling aftercare; adductor stretch at work Person educated: Patient Education method: [employer] Technology [employer] Education comprehension: verbalized understanding   HOME EXERCISE PROGRAM: Access Code: [email or web address] URL: [email or web address] Date: 02/25/2023 Prepared by: [facility]  Exercises - Supine Quadriceps Stretch with Strap on Table  - 2 x daily - 7 x weekly - 10 reps - 10 seconds hold - Side Lunge Adductor Stretch  - 2 x daily - 7 x weekly - 2 sets - 30 seconds hold - Standing Terminal Knee Extension with Resistance  - 1 x daily - 7 x weekly - 2 sets - 10 reps - Seated Knee Extension Stretch with Chair  - 2 x daily - 7 x weekly - 2-3 minutes hold   ASSESSMENT:   CLINICAL IMPRESSION: Pt had increased quad soreness following last session, but pain was improving by Wednesday of this week. Still a lot of pain with prolonged standing and knee flexion. She lacks terminal knee extension ROM. Pain increased to 5/10 with exercises during session. PT completed end range knee extension mobilization which greatly improved her standing and gait mechanics. She was encouraged to focus on all of her HEP rather than parts of it to ensure the ROM was maintained. Pt reported decrease in knee pain from 5/10 to 3/10 end of  session.  OBJECTIVE IMPAIRMENTS: Abnormal gait, decreased activity tolerance, decreased endurance, decreased mobility, difficulty walking, decreased ROM, decreased strength, hypomobility, increased muscle spasms, improper body mechanics, and pain.    ACTIVITY LIMITATIONS: sitting, standing, squatting, stairs, transfers, locomotion level, and caring for others   PARTICIPATION LIMITATIONS: community activity, occupation, and yard work   PERSONAL FACTORS: Age and Fitness are also affecting patient's functional outcome.    REHAB POTENTIAL: Good   CLINICAL DECISION MAKING: Stable/uncomplicated   EVALUATION COMPLEXITY: Low     GOALS: Goals reviewed with patient? Yes   SHORT TERM GOALS:  Target date: 02/08/23 Pt will be independent with initial HEP to improve LE ROM and mobility.  Baseline: Goal status: MET       LONG TERM GOALS: Target date: 03/29/23   Pt will be able to complete TUG in less than 14 seconds. Baseline:  Goal status: INITIAL   2.  Pt will have improved pain and function, evident by her ability to complete up to a 4 hour shift at work. Baseline:  Goal status: INITIAL   3.  Pt will report atleast 50% improvement in her knee pain from the start of PT. Baseline:  Goal status: INITIAL   4.  Pt will have improved Lt quad strength to 5/5 MMT. Baseline:  Goal status: INITIAL   5.  Pt will be able to ascend and descend 4 steps with or without handrail and step over step pattern Baseline:  Goal status: MET       PLAN:   PT FREQUENCY: 1-2x/week   PT DURATION: 8 weeks   PLANNED INTERVENTIONS: Therapeutic exercises, Therapeutic activity, Neuromuscular re-education, Balance training, Gait training, Patient/Family education, Self Care, Joint mobilization, Aquatic Therapy, Dry Needling, Vasopneumatic device, Manual therapy, and Re-evaluation   PLAN FOR NEXT SESSION: f/u on HEP; terminal knee extension- progress strength with this    2:42 PM,03/11/23 Donita Brooks  PT, DPT [facility] [place]  [phone]

## 2023-03-18 ENCOUNTER — Encounter: Payer: Medicare Other | Admitting: Physical Therapy

## 2023-03-25 ENCOUNTER — Ambulatory Visit: Payer: Medicare Other | Attending: Orthopedic Surgery | Admitting: Physical Therapy

## 2023-03-25 DIAGNOSIS — M25562 Pain in left knee: Secondary | ICD-10-CM | POA: Insufficient documentation

## 2023-03-25 DIAGNOSIS — M25662 Stiffness of left knee, not elsewhere classified: Secondary | ICD-10-CM | POA: Insufficient documentation

## 2023-03-25 NOTE — Therapy (Signed)
OUTPATIENT PHYSICAL THERAPY TREATMENT NOTE   Patient Name: Madison Tate MRN: [id number] DOB:1951/10/09, 72 y.o., female Today's Date: 03/11/2023  PCP: General REFERRING PROVIDER: Myrene Galas, MD   END OF SESSION:   PT End of Session - 03/11/23 1108     Visit Number 6    Date for PT Re-Evaluation 03/29/23    Authorization Type Medicare A and B    Authorization Time Period 02/01/23 to 03/29/23    Progress Note Due on Visit 10    PT Start Time 1050    PT Stop Time 1146    PT Time Calculation (min) 56 min    Activity Tolerance Patient tolerated treatment well;No increased pain    Behavior During Therapy WFL for tasks assessed/performed                 Past Medical History:  Diagnosis Date   Anxiety    Diabetes mellitus without complication (HCC)    Hypercholesteremia    Hypertension    TIA (transient ischemic attack)    Past Surgical History:  Procedure Laterality Date   ABDOMINAL HYSTERECTOMY     There are no problems to display for this patient.   REFERRING DIAG: Medial meniscus Lt knee   THERAPY DIAG:  Acute pain of left knee  Stiffness of left knee, not elsewhere classified  Rationale for Evaluation and Treatment Rehabilitation  PERTINENT HISTORY: DM  PRECAUTIONS:None  SUBJECTIVE:                                                                                                                                                                                      SUBJECTIVE STATEMENT:  I soaked for an 1 hour in Epsom salts 20# and that really helped.  Before that it was an 8/10.  I have an activity monitor on until Monday.  Seated HS curls causes muscle cramps.  On magnesium now.    PAIN:  Are you having pain? 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    PATIENT SURVEYS:  FOTO 42, 63 goal   COGNITION: Overall cognitive status: Within functional limits for tasks assessed                         SENSATION: Denies numbness/tingling     MUSCLE LENGTH:   Thomas test: Right  deg; Left 90 deg (+) pain   POSTURE: weight shift right   PALPATION: Muscle spasm Lt  quad, Lt adductor, medial knee    LOWER EXTREMITY ROM:   Active ROM Right eval Left eval  Hip flexion      Hip extension      Hip abduction      Hip adduction      Hip internal rotation      Hip external rotation      Knee flexion 140 125  Knee extension 0 -5  Ankle dorsiflexion      Ankle plantarflexion      Ankle inversion      Ankle eversion       (Blank rows = not tested) 02/25/23: Lt knee flexion: 130 deg, extension lacking 5 deg  LOWER EXTREMITY MMT:   MMT Right eval Left eval  Hip flexion 5 5  Hip extension 5 5  Hip abduction 5 5  Hip adduction      Hip internal rotation      Hip external rotation      Knee flexion      Knee extension 5 3 (+) pain  Ankle dorsiflexion      Ankle plantarflexion      Ankle inversion      Ankle eversion       (Blank rows = not tested)   LOWER EXTREMITY SPECIAL TESTS:      FUNCTIONAL TESTS:  5 times sit to stand: 14 sec, UE pushing into armrests  Timed up and go (TUG): 17 sec, slow turning   GAIT: Distance walked: Lt antalgic patter, decreased knee extension on Lt  Assistive device utilized:  none Level of assistance: Complete Independence Comments: stairs- prefers step to pattern with Rt LE leading      TODAY'S TREATMENT:  5/10: Nu-Step L1 5 min Discussion of length of time for recovery Discussed option of aquatic PT Seated blue band press out 10x 2 Seated blue band HS curls anchored around other leg 10x2 Seated blue band clams 20x 4 inch forward step ups with left 2x5 (discussed low volume of these and not on full height  step)     03/11/23 TUG 11sec Steps x2 trials reciprocal pattern (+) pain Leg press single leg #35 2x10 reps each  Standing knee flexion #2 2x10 reps each Standing adductor stretch 2x20 sec Lt Standing hamstring stretch in chair 2x20 sec  Seated LAQ #2 x15 reps Supine SAQ #2 2x15 reps Manual: Lt Knee terminal extension mobilization in supine and prone, grade III-IV x3 bouts each position  03/04/23 Bike x5 min PT present to discuss knee pain at work Standing hip adductor slide with yellow TB x10 reps each side Seated Lt quad isometric at varied degrees of flexion up to 90 deg for 5 sec, 10x Trigger Point Dry-Needling  Treatment instructions: Expect mild to moderate muscle soreness. S/S of pneumothorax if dry needled over a lung field, and to seek immediate medical attention should they occur. Patient verbalized understanding of these instructions and education.   Patient Consent Given: Yes Education handout provided: Yes Muscles treated: Lt adductors, quads Electrical stimulation performed: No/Yes Parameters: N/A Treatment response/outcome: improved soft tissue mobility Manual: STM Lt adductors and quads   02/25/23: Nustep L1 x5 min PT present to  Standing hamstring stretch Lt only x20 sec hold  Standing Lt steps up onto 6" step (+) pain anterior knee, x10 reps Step up Lt 4" x10 reps Manual:  Lt knee extension mobilization: AP tibiofibular mobilization grade III-IV x3 bouts Lt knee closed chain flexion MWM on 6' step, pt lunge forward 2x10  reps without pain STM and rolling stick to Lt quads    PATIENT EDUCATION:  Education details: dry needling aftercare; adductor stretch at work Person educated: Patient Education method: [employer] Technology [employer] Education comprehension: verbalized understanding   HOME EXERCISE PROGRAM Access Code: [email or web address] URL: [email or web address] Date: 03/25/2023 Prepared by: Lavinia Sharps  Exercises - Supine Quadriceps Stretch with  Strap on Table  - 2 x daily - 7 x weekly - 10 reps - 10 seconds hold - Side Lunge Adductor Stretch  - 2 x daily - 7 x weekly - 2 sets - 30 seconds hold - Standing Terminal Knee Extension with Resistance  - 1 x daily - 7 x weekly - 2 sets - 10 reps - Seated Knee Extension Stretch with Chair  - 2 x daily - 7 x weekly - 2-3 minutes hold - Seated Leg Press with Resistance  - 1 x daily - 7 x weekly - 1-2 sets - 10 reps - Seated Hamstring Curls with Resistance  - 1 x daily - 7 x weekly - 1-2 sets - 10 reps - Seated Hip Abduction with Resistance  - 1 x daily - 7 x weekly - 1-2 sets - 10 reps - Step Up  - 1 x daily - 7 x weekly - 1-2 sets - 5 reps   ASSESSMENT:   CLINICAL IMPRESSION: Decreased pain level today compared to previous visits and able to progress with resistive exercise.  Updated HEP to include the progression.  No increase in pain level reported.  Extensive period of time discussing volume of exercise and expected length of time for recovery.  Therapist monitoring response to all and modifying treatment accordingly.     OBJECTIVE IMPAIRMENTS: Abnormal gait, decreased activity tolerance, decreased endurance, decreased mobility, difficulty walking, decreased ROM, decreased strength, hypomobility, increased muscle spasms, improper body mechanics, and pain.    ACTIVITY LIMITATIONS: sitting, standing, squatting, stairs, transfers, locomotion level, and caring for others   PARTICIPATION LIMITATIONS: community activity, occupation, and yard work   PERSONAL FACTORS: Age and Fitness are also affecting patient's functional outcome.    REHAB POTENTIAL: Good   CLINICAL DECISION MAKING: Stable/uncomplicated   EVALUATION COMPLEXITY: Low     GOALS: Goals reviewed with patient? Yes   SHORT TERM GOALS: Target date: 02/08/23 Pt will be independent with initial HEP to improve LE ROM and mobility.  Baseline: Goal status: MET       LONG TERM GOALS: Target date: 03/29/23   Pt will be able to  complete TUG in less than 14 seconds. Baseline:  Goal status: INITIAL   2.  Pt will have improved pain and function, evident by her ability to complete up to a 4 hour shift at work. Baseline:  Goal status: INITIAL   3.  Pt will report atleast 50% improvement in her knee pain from the start of PT. Baseline:  Goal status: INITIAL   4.  Pt will have improved Lt quad strength to 5/5 MMT. Baseline:  Goal status: INITIAL   5.  Pt will be able to ascend and descend 4 steps with or without handrail and step over step pattern Baseline:  Goal status: MET       PLAN:   PT FREQUENCY: 1-2x/week   PT DURATION: 8 weeks   PLANNED INTERVENTIONS: Therapeutic exercises, Therapeutic activity, Neuromuscular re-education, Balance training, Gait training, Patient/Family education, Self Care, Joint mobilization, Aquatic Therapy, Dry Needling, Vasopneumatic device, Manual therapy, and Re-evaluation   PLAN FOR NEXT SESSION: ERO next visit;  f/u  on HEP progression with blue band and low level step ups   Lavinia Sharps, PT 03/25/23 11:01 AM Phone: [phone] Fax: [phone]

## 2023-04-01 ENCOUNTER — Ambulatory Visit: Payer: Medicare Other | Admitting: Physical Therapy

## 2023-04-01 ENCOUNTER — Encounter: Payer: Self-pay | Admitting: Physical Therapy

## 2023-04-01 DIAGNOSIS — M25562 Pain in left knee: Secondary | ICD-10-CM

## 2023-04-01 DIAGNOSIS — M25662 Stiffness of left knee, not elsewhere classified: Secondary | ICD-10-CM

## 2023-04-01 NOTE — Therapy (Signed)
OUTPATIENT PHYSICAL THERAPY TREATMENT NOTE/RE-EVAL   Patient Name: Madison Tate MRN: [id number] DOB:1951/01/07, 72 y.o., female Today's Date: 04/01/2023  PCP: General REFERRING PROVIDER: Myrene Galas, MD   END OF SESSION:   PT End of Session - 04/01/23 0931     Visit Number 8    Date for PT Re-Evaluation 03/29/23    Authorization Type Medicare A and B    Authorization Time Period NEW 03/30/23 to 06/07/23    Progress Note Due on Visit 10    PT Start Time 0930    PT Stop Time 1015    PT Time Calculation (min) 45 min    Activity Tolerance Patient tolerated treatment well;No increased pain    Behavior During Therapy WFL for tasks assessed/performed                  Past Medical History:  Diagnosis Date   Anxiety    Diabetes mellitus without complication (HCC)    Hypercholesteremia    Hypertension    TIA (transient ischemic attack)    Past Surgical History:  Procedure Laterality Date   ABDOMINAL HYSTERECTOMY     There are no problems to display for this patient.   REFERRING DIAG: Medial meniscus Lt knee   THERAPY DIAG:  Acute pain of left knee  Stiffness of left knee, not elsewhere classified  Rationale for Evaluation and Treatment Rehabilitation  PERTINENT HISTORY: DM  PRECAUTIONS:None  SUBJECTIVE:                                                                                                                                                                                      SUBJECTIVE STATEMENT:  Pt states that her knee is better but it lingers around a 3 or 4. She is worried about getting out of her bathtub following a soak.   PAIN:  Are you having pain? Yes: NPRS scale: 3/10 Pain location: Lt knee Pain description: sore Aggravating factors: Being up on it Relieving factors: Not messing with it   OBJECTIVE: (objective measures completed at initial evaluation unless otherwise dated) DIAGNOSTIC FINDINGS: MRI: Mild-to-moderate tricompartment  osteoarthritis    PATIENT SURVEYS:  FOTO 42, 63 goal 04/01/23: 58   COGNITION: Overall cognitive status: Within functional limits for tasks assessed                         SENSATION: Denies numbness/tingling     MUSCLE LENGTH:   Maisie Fus test: Right  deg; Left 90 deg (+) pain Prone ely test 04/01/23: Lt 95 deg, Rt 90   POSTURE: weight shift right   PALPATION: Muscle spasm Lt quad, Lt  adductor, medial knee    LOWER EXTREMITY ROM:   Active ROM Right eval Left eval  Hip flexion      Hip extension      Hip abduction      Hip adduction      Hip internal rotation      Hip external rotation      Knee flexion 140 125  Knee extension 0 -5  Ankle dorsiflexion      Ankle plantarflexion      Ankle inversion      Ankle eversion       (Blank rows = not tested) 02/25/23: Lt knee flexion: 130 deg, extension lacking 5 deg 04/01/23: Lt knee flexion 140 deg, extension 0 deg  LOWER EXTREMITY MMT:   MMT Right eval Left eval  Hip flexion 5 5  Hip extension 5 5  Hip abduction 5 5  Hip adduction      Hip internal rotation      Hip external rotation      Knee flexion      Knee extension 5 3 (+) pain 04/01/23 5/5  Ankle dorsiflexion      Ankle plantarflexion      Ankle inversion      Ankle eversion       (Blank rows = not tested)   LOWER EXTREMITY SPECIAL TESTS:      FUNCTIONAL TESTS:  5 times sit to stand: 14 sec, UE pushing into armrests  Timed up and go (TUG): 17 sec, slow turning   GAIT: Distance walked: Lt antalgic patter, decreased knee extension on Lt  Assistive device utilized:  none Level of assistance: Complete Independence Comments: stairs- prefers step to pattern with Rt LE leading      TODAY'S TREATMENT:   04/01/23 Manual:  Lt TKA mobilization STM Lt quad/adductors Prone quad stretch Rt 90 deg, Lt 95 deg  Attempting floor to stand from tall kneeling position, discussing hand placement and benefits of airex pad for knee cushion. HEP discussed and  encouraged pt to perform stretches following epsom soak     5/10: Nu-Step L1 5 min Discussion of length of time for recovery Discussed option of aquatic PT Seated blue band press out 10x 2 Seated blue band HS curls anchored around other leg 10x2 Seated blue band clams 20x 4 inch forward step ups with left 2x5 (discussed low volume of these and not on full height step)     03/11/23 TUG 11sec Steps x2 trials reciprocal pattern (+) pain Leg press single leg #35 2x10 reps each  Standing knee flexion #2 2x10 reps each Standing adductor stretch 2x20 sec Lt Standing hamstring stretch in chair 2x20 sec  Seated LAQ #2 x15 reps Supine SAQ #2 2x15 reps Manual: Lt Knee terminal extension mobilization in supine and prone, grade III-IV x3 bouts each position  03/04/23 Bike x5 min PT present to discuss knee pain at work Standing hip adductor slide with yellow TB x10 reps each side Seated Lt quad isometric at varied degrees of flexion up to 90 deg for 5 sec, 10x Trigger Point Dry-Needling  Treatment instructions: Expect mild to moderate muscle soreness. S/S of pneumothorax if dry needled over a lung field, and to seek immediate medical attention should they occur. Patient verbalized understanding of these instructions and education.   Patient Consent Given: Yes Education handout provided: Yes Muscles treated: Lt adductors, quads Electrical stimulation performed: No/Yes Parameters: N/A Treatment response/outcome: improved soft tissue mobility Manual: STM Lt adductors and quads  PATIENT EDUCATION:  Education details: updates to goals/POC; HEP  Person educated: Patient Education method: Chief Technology Officer Education comprehension: verbalized understanding   HOME EXERCISE PROGRAM Access Code: [email or web address] URL: [email or web address] Date: 04/01/2023 Prepared by: [facility] - [facility]  Exercises - Supine Quadriceps Stretch with  Strap on Table  - 2 x daily - 7 x weekly - 10 reps - 10 seconds hold - Side Lunge Adductor Stretch  - 2 x daily - 7 x weekly - 2 sets - 30 seconds hold - Standing Terminal Knee Extension with Resistance  - 1 x daily - 7 x weekly - 2 sets - 10 reps - Seated Knee Extension Stretch with Chair  - 2 x daily - 7 x weekly - 2-3 minutes hold - Seated Leg Press with Resistance  - 1 x daily - 7 x weekly - 1-2 sets - 10 reps - Seated Hamstring Curls with Resistance  - 1 x daily - 7 x weekly - 1-2 sets - 10 reps - Seated Hip Abduction with Resistance  - 1 x daily - 7 x weekly - 1-2 sets - 10 reps - Step Up  - 1 x daily - 7 x weekly - 1-2 sets - 5 reps   ASSESSMENT:   CLINICAL IMPRESSION: Pt is making steady progress towards her goals, having met 3 of her long term goals since beginning PT. Pt's MMT of the Lt quad is improved to 5/5 without pain. She is still having pain with closed chain activity, standing, negotiating stairs and completing sit to stand. Pt's knee ROM is improved to WNL, but she lacks terminal knee extension on the Lt by a few degrees. This is evident in her standing and walking posture. Pt's knee pain still limits her ability to stand at work for the minimum of 4 hours. She would benefit from continuation of skilled PT, particularly aquatic PT to facilitate more closed chain exercise, to address her ROM restrictions, and LE endurance and closed chain strength in order to promote her return to work and ADLs without limitation.    OBJECTIVE IMPAIRMENTS: Abnormal gait, decreased activity tolerance, decreased endurance, decreased mobility, difficulty walking, decreased ROM, decreased strength, hypomobility, increased muscle spasms, improper body mechanics, and pain.    ACTIVITY LIMITATIONS: sitting, standing, squatting, stairs, transfers, locomotion level, and caring for others   PARTICIPATION LIMITATIONS: community activity, occupation, and yard work   PERSONAL FACTORS: Age and Fitness are  also affecting patient's functional outcome.    REHAB POTENTIAL: Good   CLINICAL DECISION MAKING: Stable/uncomplicated   EVALUATION COMPLEXITY: Low     GOALS: Goals reviewed with patient? Yes   SHORT TERM GOALS: Target date: 02/08/23 Pt will be independent with initial HEP to improve LE ROM and mobility.  Baseline: Goal status: MET       LONG TERM GOALS: Target date: 03/29/23   Pt will be able to complete TUG in less than 14 seconds. Baseline:  Goal status: INITIAL   2.  Pt will have improved pain and function, evident by her ability to complete up to a 4 hour shift at work. Baseline: 2 hours  Goal status: PARTIALLY MET   3.  Pt will report atleast 50% improvement in her knee pain from the start of PT. Baseline:  Goal status:MET   4.  Pt will have improved Lt quad strength to 5/5 MMT. Baseline: MET Goal status: INITIAL   5.  Pt will be able to ascend and descend 4 steps  with or with step over step pattern 2/3 trials without pain.  Baseline: Pt will be able to ascend and descend 4 steps with or without handrail and step over step pattern Goal status: NEW       PLAN:   PT FREQUENCY: 1x/week   PT DURATION: 7 weeks   PLANNED INTERVENTIONS: Therapeutic exercises, Therapeutic activity, Neuromuscular re-education, Balance training, Gait training, Patient/Family education, Self Care, Joint mobilization, Aquatic Therapy, Dry Needling, Vasopneumatic device, Manual therapy, and Re-evaluation   PLAN FOR NEXT SESSION: f/u on HEP and progress closed chain quad/knee strength; aquatic info; STM as needed to quad/adductors  10:37 AM,04/01/23 Donita Brooks PT, DPT [facility] [place]  [phone]

## 2023-04-13 ENCOUNTER — Ambulatory Visit: Payer: Medicare Other | Admitting: Physical Therapy

## 2023-04-26 ENCOUNTER — Ambulatory Visit: Payer: Medicare Other | Attending: Orthopedic Surgery | Admitting: Physical Therapy

## 2023-04-26 DIAGNOSIS — M25562 Pain in left knee: Secondary | ICD-10-CM | POA: Diagnosis present

## 2023-04-26 DIAGNOSIS — M25662 Stiffness of left knee, not elsewhere classified: Secondary | ICD-10-CM | POA: Insufficient documentation

## 2023-04-26 NOTE — Therapy (Signed)
OUTPATIENT PHYSICAL THERAPY TREATMENT NOTE/RE-EVAL   Patient Name: Madison Tate MRN: [id number] DOB:01/12/1951, 72 y.o., female Today's Date: 04/26/2023  PCP: General REFERRING PROVIDER: Myrene Galas, MD   END OF SESSION:   PT End of Session - 04/26/23 1446     Visit Number 9    Date for PT Re-Evaluation 06/07/23    Authorization Type Medicare A and B    Authorization Time Period NEW 03/30/23 to 06/07/23    Progress Note Due on Visit 10    PT Start Time 1446    PT Stop Time 1526    PT Time Calculation (min) 40 min    Activity Tolerance Patient tolerated treatment well;No increased pain                  Past Medical History:  Diagnosis Date   Anxiety    Diabetes mellitus without complication (HCC)    Hypercholesteremia    Hypertension    TIA (transient ischemic attack)    Past Surgical History:  Procedure Laterality Date   ABDOMINAL HYSTERECTOMY     There are no problems to display for this patient.   REFERRING DIAG: Medial meniscus Lt knee   THERAPY DIAG:  Acute pain of left knee  Stiffness of left knee, not elsewhere classified  Rationale for Evaluation and Treatment Rehabilitation  PERTINENT HISTORY: DM  PRECAUTIONS:None  SUBJECTIVE:                                                                                                                                                                                      SUBJECTIVE STATEMENT:  things are not going well.  I am really frustrated.  Steps aggravate it both ways.  Worked yesterday and didn't sleep well last night.  Using blue band and it doesn't hurt while I'm doing it.    Madison Tate  PAIN:  Are you having pain? 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    PATIENT SURVEYS:  FOTO 42, 63 goal 04/01/23: 58   COGNITION: Overall cognitive status: Within functional limits for tasks assessed                         SENSATION: Denies numbness/tingling     MUSCLE LENGTH:   Maisie Fus test: Right  deg; Left 90 deg (+) pain Prone ely test 04/01/23: Lt 95 deg, Rt 90   POSTURE: weight shift right   PALPATION:  Muscle spasm Lt quad, Lt adductor, medial knee    LOWER EXTREMITY ROM:   Active ROM Right eval Left eval  Hip flexion      Hip extension      Hip abduction      Hip adduction      Hip internal rotation      Hip external rotation      Knee flexion 140 125  Knee extension 0 -5  Ankle dorsiflexion      Ankle plantarflexion      Ankle inversion      Ankle eversion       (Blank rows = not tested) 02/25/23: Lt knee flexion: 130 deg, extension lacking 5 deg 04/01/23: Lt knee flexion 140 deg, extension 0 deg  LOWER EXTREMITY MMT:   MMT Right eval Left eval Left 6/11  Hip flexion 5 5   Hip extension 5 5   Hip abduction 5 5   Hip adduction       Hip internal rotation       Hip external rotation       Knee flexion       Knee extension 5 3 (+) pain 04/01/23 5/5 4- painful  Ankle dorsiflexion       Ankle plantarflexion       Ankle inversion       Ankle eversion        (Blank rows = not tested)   LOWER EXTREMITY SPECIAL TESTS:      FUNCTIONAL TESTS:  5 times sit to stand: 14 sec, UE pushing into armrests  Timed up and go (TUG): 17 sec, slow turning   GAIT: Distance walked: Lt antalgic patter, decreased knee extension on Lt  Assistive device utilized:  none Level of assistance: Complete Independence Comments: stairs- prefers step to pattern with Rt LE leading      TODAY'S TREATMENT:  6/11: Nu-Step L1 5 min Forward 4 inch step ups 2x5 Lateral step ups painful inferior knee 5x  2 inch step downs 2 sets of 5 Single leg calf raise 2 sets of 5 KT patellar tendon 2 strips (good relief  reported) Leg press seat 7 70# 10x 35# 20x HS red power cord 2x10 Hip machine 40#: abduction, extension and flexion 10x each   04/01/23 Manual:  Lt TKA mobilization STM Lt quad/adductors Prone quad stretch Rt 90 deg, Lt 95 deg  Attempting floor to stand from tall kneeling position, discussing hand placement and benefits of airex pad for knee cushion. HEP discussed and encouraged pt to perform stretches following epsom soak     5/10: Nu-Step L1 5 min Discussion of length of time for recovery Discussed option of aquatic PT Seated blue band press out 10x 2 Seated blue band HS curls anchored around other leg 10x2 Seated blue band clams 20x 4 inch forward step ups with left 2x5 (discussed low volume of these and not on full height step)      PATIENT EDUCATION:  Education details: updates to goals/POC; HEP  Person educated: Patient Education method: Explanation and Handouts Education comprehension: verbalized understanding   HOME EXERCISE PROGRAM Access Code: [email or web address] URL: [email or web address] Date: 04/01/2023 Prepared by: [facility] - Outpatient Rehab - [facility]  Exercises - Supine Quadriceps Stretch with Strap on Table  - 2 x daily - 7 x weekly - 10 reps - 10 seconds hold - Side Lunge Adductor Stretch  - 2 x daily - 7 x weekly - 2 sets - 30 seconds hold - Standing  Terminal Knee Extension with Resistance  - 1 x daily - 7 x weekly - 2 sets - 10 reps - Seated Knee Extension Stretch with Chair  - 2 x daily - 7 x weekly - 2-3 minutes hold - Seated Leg Press with Resistance  - 1 x daily - 7 x weekly - 1-2 sets - 10 reps - Seated Hamstring Curls with Resistance  - 1 x daily - 7 x weekly - 1-2 sets - 10 reps - Seated Hip Abduction with Resistance  - 1 x daily - 7 x weekly - 1-2 sets - 10 reps - Step Up  - 1 x daily - 7 x weekly - 1-2 sets - 5 reps   ASSESSMENT:   CLINICAL IMPRESSION: Pain inferior knee today with tenderness in patellar tendon  and fat pad regions.  Therapist providing verbal cues to optimize technique with exercises in order to achieve the greatest benefit.  Good initial response with kinesiotaping and exercise with decreased pain 3/10 post session.   Patient reports she was really frustrated on arrival today but feels much better when leaving.    OBJECTIVE IMPAIRMENTS: Abnormal gait, decreased activity tolerance, decreased endurance, decreased mobility, difficulty walking, decreased ROM, decreased strength, hypomobility, increased muscle spasms, improper body mechanics, and pain.    ACTIVITY LIMITATIONS: sitting, standing, squatting, stairs, transfers, locomotion level, and caring for others   PARTICIPATION LIMITATIONS: community activity, occupation, and yard work   PERSONAL FACTORS: Age and Fitness are also affecting patient's functional outcome.    REHAB POTENTIAL: Good   CLINICAL DECISION MAKING: Stable/uncomplicated   EVALUATION COMPLEXITY: Low     GOALS: Goals reviewed with patient? Yes   SHORT TERM GOALS: Target date: 02/08/23 Pt will be independent with initial HEP to improve LE ROM and mobility.  Baseline: Goal status: MET       LONG TERM GOALS: Target date: 06/07/23   Pt will be able to complete TUG in less than 14 seconds. Baseline:  Goal status: INITIAL   2.  Pt will have improved pain and function, evident by her ability to complete up to a 4 hour shift at work. Baseline: 2 hours  Goal status: PARTIALLY MET   3.  Pt will report atleast 50% improvement in her knee pain from the start of PT. Baseline:  Goal status:MET   4.  Pt will have improved Lt quad strength to 5/5 MMT. Baseline: MET Goal status: INITIAL   5.  Pt will be able to ascend and descend 4 steps with or with step over step pattern 2/3 trials without pain.  Baseline: Pt will be able to ascend and descend 4 steps with or without handrail and step over step pattern Goal status: NEW       PLAN:   PT FREQUENCY:  1x/week   PT DURATION: 7 weeks   PLANNED INTERVENTIONS: Therapeutic exercises, Therapeutic activity, Neuromuscular re-education, Balance training, Gait training, Patient/Family education, Self Care, Joint mobilization, Aquatic Therapy, Dry Needling, Vasopneumatic device, Manual therapy, and Re-evaluation   PLAN FOR NEXT SESSION: check response to KT patellar tendon region;  glute, gastroc, HS, quad strength; aquatic PT trial  Lavinia Sharps, PT 04/26/23 4:27 PM Phone: [phone] Fax: [phone]

## 2023-05-03 ENCOUNTER — Encounter (HOSPITAL_BASED_OUTPATIENT_CLINIC_OR_DEPARTMENT_OTHER): Payer: Self-pay | Admitting: Physical Therapy

## 2023-05-03 ENCOUNTER — Ambulatory Visit (HOSPITAL_BASED_OUTPATIENT_CLINIC_OR_DEPARTMENT_OTHER): Payer: Medicare Other | Attending: Orthopedic Surgery | Admitting: Physical Therapy

## 2023-05-03 DIAGNOSIS — M25562 Pain in left knee: Secondary | ICD-10-CM | POA: Insufficient documentation

## 2023-05-03 DIAGNOSIS — M25662 Stiffness of left knee, not elsewhere classified: Secondary | ICD-10-CM | POA: Diagnosis present

## 2023-05-03 NOTE — Therapy (Signed)
OUTPATIENT PHYSICAL THERAPY TREATMENT NOTE   Patient Name: Madison Tate MRN: [id number] DOB:1951-10-05, 72 y.o., female Today's Date: 05/03/2023  PCP: General REFERRING PROVIDER: Myrene Galas, MD   END OF SESSION:   PT End of Session - 05/03/23 1506     Visit Number 10    Date for PT Re-Evaluation 06/07/23    Authorization Type Medicare A and B    Authorization Time Period NEW 03/30/23 to 06/07/23/Recert completed on visit 8. Progress  ote due vist #[id number]    Progress Note Due on Visit 18    PT Start Time 1506    PT Stop Time 1550    PT Time Calculation (min) 44 min    Activity Tolerance Patient tolerated treatment well;No increased pain    Behavior During Therapy WFL for tasks assessed/performed                  Past Medical History:  Diagnosis Date   Anxiety    Diabetes mellitus without complication (HCC)    Hypercholesteremia    Hypertension    TIA (transient ischemic attack)    Past Surgical History:  Procedure Laterality Date   ABDOMINAL HYSTERECTOMY     There are no problems to display for this patient.   REFERRING DIAG: Medial meniscus Lt knee   THERAPY DIAG:  Acute pain of left knee  Stiffness of left knee, not elsewhere classified  Rationale for Evaluation and Treatment Rehabilitation  PERTINENT HISTORY: DM  PRECAUTIONS:None  SUBJECTIVE:                                                                                                                                                                                      SUBJECTIVE STATEMENT:  Worked this morning and knee is sore.   GOES BY Madison Tate  PAIN:  Are you having pain? Yes: NPRS scale: 3/10 Pain location: Lt knee Pain description: sore Aggravating factors: Being up on it Relieving factors: Not messing with it   OBJECTIVE: (objective measures completed at initial evaluation unless otherwise dated) DIAGNOSTIC FINDINGS: MRI: Mild-to-moderate tricompartment osteoarthritis    PATIENT  SURVEYS:  FOTO 42, 63 goal 04/01/23: 58   COGNITION: Overall cognitive status: Within functional limits for tasks assessed                         SENSATION: Denies numbness/tingling     MUSCLE LENGTH:   Maisie Fus test: Right  deg; Left 90 deg (+) pain Prone ely test 04/01/23: Lt 95 deg, Rt 90   POSTURE: weight shift right   PALPATION: Muscle spasm Lt quad, Lt adductor, medial knee  LOWER EXTREMITY ROM:   Active ROM Right eval Left eval  Hip flexion      Hip extension      Hip abduction      Hip adduction      Hip internal rotation      Hip external rotation      Knee flexion 140 125  Knee extension 0 -5  Ankle dorsiflexion      Ankle plantarflexion      Ankle inversion      Ankle eversion       (Blank rows = not tested) 02/25/23: Lt knee flexion: 130 deg, extension lacking 5 deg 04/01/23: Lt knee flexion 140 deg, extension 0 deg  LOWER EXTREMITY MMT:   MMT Right eval Left eval Left 6/11  Hip flexion 5 5   Hip extension 5 5   Hip abduction 5 5   Hip adduction       Hip internal rotation       Hip external rotation       Knee flexion       Knee extension 5 3 (+) pain 04/01/23 5/5 4- painful  Ankle dorsiflexion       Ankle plantarflexion       Ankle inversion       Ankle eversion        (Blank rows = not tested)   LOWER EXTREMITY SPECIAL TESTS:      FUNCTIONAL TESTS:  5 times sit to stand: 14 sec, UE pushing into armrests  Timed up and go (TUG): 17 sec, slow turning   GAIT: Distance walked: Lt antalgic patter, decreased knee extension on Lt  Assistive device utilized:  none Level of assistance: Complete Independence Comments: stairs- prefers step to pattern with Rt LE leading      TODAY'S TREATMENT:   6/18: Pt seen for aquatic therapy today.  Treatment took place in water 3.5-4.75 ft in depth at the [facility] pool. Temp of water was 91.  Pt entered/exited the pool via stairs using step through pattern with hand rail.  *Intro to  setting *Walking forward, back and side stepping in all depths *monster walk x 2 width *standing ue support hand buoys: relaxed squats; squats x 10 *forward step ups bottom step (6 inch/50% support) leading R/L x10 *forward step downs R/L 2 x 5 *Seated on 3rd step: flutter kicking; hip abd/add *STS from 3rd step x 10. Cues for weight through heels and hip hiking *TrA sets: yellow noodle pull down wide stance then staggered x 10 reps *straddling noodle cycling x 6 widths *walking between exercises for recovery  Pt requires the buoyancy and hydrostatic pressure of water for support, and to offload joints by unweighting joint load by at least 50 % in navel deep water and by at least 75-80% in chest to neck deep water.  Viscosity of the water is needed for resistance of strengthening. Water current perturbations provides challenge to standing balance requiring increased core activation.    6/11: Nu-Step L1 5 min Forward 4 inch step ups 2x5 Lateral step ups painful inferior knee 5x  2 inch step downs 2 sets of 5 Single leg calf raise 2 sets of 5 KT patellar tendon 2 strips (good relief reported) Leg press seat 7 70# 10x 35# 20x HS red power cord 2x10 Hip machine 40#: abduction, extension and flexion 10x each   04/01/23 Manual:  Lt TKA mobilization STM Lt quad/adductors Prone quad stretch Rt 90 deg, Lt 95 deg  Attempting floor  to stand from tall kneeling position, discussing hand placement and benefits of airex pad for knee cushion. HEP discussed and encouraged pt to perform stretches following epsom soak     5/10: Nu-Step L1 5 min Discussion of length of time for recovery Discussed option of aquatic PT Seated blue band press out 10x 2 Seated blue band HS curls anchored around other leg 10x2 Seated blue band clams 20x 4 inch forward step ups with left 2x5 (discussed low volume of these and not on full height step)      PATIENT EDUCATION:  Education details: updates to  goals/POC; HEP  Person educated: Patient Education method: Explanation and Handouts Education comprehension: verbalized understanding   HOME EXERCISE PROGRAM Access Code: [email or web address] URL: [email or web address] Date: 04/01/2023 Prepared by: [facility] - Outpatient Rehab - [facility]  Exercises - Supine Quadriceps Stretch with Strap on Table  - 2 x daily - 7 x weekly - 10 reps - 10 seconds hold - Side Lunge Adductor Stretch  - 2 x daily - 7 x weekly - 2 sets - 30 seconds hold - Standing Terminal Knee Extension with Resistance  - 1 x daily - 7 x weekly - 2 sets - 10 reps - Seated Knee Extension Stretch with Chair  - 2 x daily - 7 x weekly - 2-3 minutes hold - Seated Leg Press with Resistance  - 1 x daily - 7 x weekly - 1-2 sets - 10 reps - Seated Hamstring Curls with Resistance  - 1 x daily - 7 x weekly - 1-2 sets - 10 reps - Seated Hip Abduction with Resistance  - 1 x daily - 7 x weekly - 1-2 sets - 10 reps - Step Up  - 1 x daily - 7 x weekly - 1-2 sets - 5 reps   ASSESSMENT:   CLINICAL IMPRESSION: Pt demonstrates safety and indep in setting with therapist instructing from deck.  Pt reports K-tape has been helpful feels it has decreased her pain some and improved her toleration to walking on cement (at work). She is directed through similar exercises completed land based with improved toleration given decreased loading with buoyancy. Added core strengthening. She demonstrates good core strength requiring thick foam/resistance for approp challenge.  She is a good candidate for aquatic therapy intervention and will benefit from the properties of water to progress towards land based goals    OBJECTIVE IMPAIRMENTS: Abnormal gait, decreased activity tolerance, decreased endurance, decreased mobility, difficulty walking, decreased ROM, decreased strength, hypomobility, increased muscle spasms, improper body mechanics, and pain.    ACTIVITY LIMITATIONS: sitting,  standing, squatting, stairs, transfers, locomotion level, and caring for others   PARTICIPATION LIMITATIONS: community activity, occupation, and yard work   PERSONAL FACTORS: Age and Fitness are also affecting patient's functional outcome.    REHAB POTENTIAL: Good   CLINICAL DECISION MAKING: Stable/uncomplicated   EVALUATION COMPLEXITY: Low     GOALS: Goals reviewed with patient? Yes   SHORT TERM GOALS: Target date: 02/08/23 Pt will be independent with initial HEP to improve LE ROM and mobility.  Baseline: Goal status: MET       LONG TERM GOALS: Target date: 06/07/23   Pt will be able to complete TUG in less than 14 seconds. Baseline:  Goal status: INITIAL   2.  Pt will have improved pain and function, evident by her ability to complete up to a 4 hour shift at work. Baseline: 2 hours  Goal status: PARTIALLY MET   3.  Pt  will report atleast 50% improvement in her knee pain from the start of PT. Baseline:  Goal status:MET   4.  Pt will have improved Lt quad strength to 5/5 MMT. Baseline: MET Goal status: INITIAL   5.  Pt will be able to ascend and descend 4 steps with or with step over step pattern 2/3 trials without pain.  Baseline: Pt will be able to ascend and descend 4 steps with or without handrail and step over step pattern Goal status: NEW       PLAN:   PT FREQUENCY: 1x/week   PT DURATION: 7 weeks   PLANNED INTERVENTIONS: Therapeutic exercises, Therapeutic activity, Neuromuscular re-education, Balance training, Gait training, Patient/Family education, Self Care, Joint mobilization, Aquatic Therapy, Dry Needling, Vasopneumatic device, Manual therapy, and Re-evaluation   PLAN FOR NEXT SESSION: check response to KT patellar tendon region;  glute, gastroc, HS, quad strength; aquatic PT trial  Corrie Dandy Tomma Lightning) Gal Feldhaus MPT 05/03/23 4:16 PM Phone: [phone] Fax: [phone]

## 2023-05-11 ENCOUNTER — Encounter (HOSPITAL_BASED_OUTPATIENT_CLINIC_OR_DEPARTMENT_OTHER): Payer: Self-pay | Admitting: Physical Therapy

## 2023-05-11 ENCOUNTER — Ambulatory Visit (HOSPITAL_BASED_OUTPATIENT_CLINIC_OR_DEPARTMENT_OTHER): Payer: Medicare Other | Admitting: Physical Therapy

## 2023-05-11 DIAGNOSIS — M25662 Stiffness of left knee, not elsewhere classified: Secondary | ICD-10-CM

## 2023-05-11 DIAGNOSIS — M25562 Pain in left knee: Secondary | ICD-10-CM | POA: Diagnosis not present

## 2023-05-11 NOTE — Therapy (Signed)
OUTPATIENT PHYSICAL THERAPY TREATMENT NOTE   Patient Name: Madison PORCARO MRN: [id number] DOB:1951/04/06, 72 y.o., female Today's Date: 05/11/2023  PCP: General REFERRING PROVIDER: Myrene Galas, MD   END OF SESSION:   PT End of Session - 05/11/23 1500     Visit Number 11    Date for PT Re-Evaluation 06/07/23    Authorization Type Medicare A and B    Authorization Time Period NEW 03/30/23 to 06/07/23/Recert completed on visit 8. Progress  ote due vist #18    Progress Note Due on Visit 18    PT Start Time 1501    PT Stop Time 1545    PT Time Calculation (min) 44 min    Activity Tolerance Patient tolerated treatment well    Behavior During Therapy WFL for tasks assessed/performed                  Past Medical History:  Diagnosis Date   Anxiety    Diabetes mellitus without complication (HCC)    Hypercholesteremia    Hypertension    TIA (transient ischemic attack)    Past Surgical History:  Procedure Laterality Date   ABDOMINAL HYSTERECTOMY     There are no problems to display for this patient.   REFERRING DIAG: Medial meniscus Lt knee   THERAPY DIAG:  Acute pain of left knee  Stiffness of left knee, not elsewhere classified  Rationale for Evaluation and Treatment Rehabilitation  PERTINENT HISTORY: DM  PRECAUTIONS:None  SUBJECTIVE:                                                                                                                                                                                      SUBJECTIVE STATEMENT:  "Worked last night and spent 4 hours pushing boxes around, pain is up today"  GOES BY Madison Tate  PAIN:  Are you having pain? 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     PATIENT SURVEYS:  FOTO 42, 63 goal 04/01/23: 58   COGNITION: Overall cognitive status: Within functional limits for tasks assessed                         SENSATION: Denies numbness/tingling     MUSCLE LENGTH:   Maisie Fus test: Right  deg; Left 90 deg (+) pain Prone ely test 04/01/23: Lt 95 deg, Rt 90   POSTURE: weight shift right   PALPATION: Muscle spasm Lt quad, Lt adductor, medial  knee    LOWER EXTREMITY ROM:   Active ROM Right eval Left eval  Hip flexion      Hip extension      Hip abduction      Hip adduction      Hip internal rotation      Hip external rotation      Knee flexion 140 125  Knee extension 0 -5  Ankle dorsiflexion      Ankle plantarflexion      Ankle inversion      Ankle eversion       (Blank rows = not tested) 02/25/23: Lt knee flexion: 130 deg, extension lacking 5 deg 04/01/23: Lt knee flexion 140 deg, extension 0 deg  LOWER EXTREMITY MMT:   MMT Right eval Left eval Left 6/11  Hip flexion 5 5   Hip extension 5 5   Hip abduction 5 5   Hip adduction       Hip internal rotation       Hip external rotation       Knee flexion       Knee extension 5 3 (+) pain 04/01/23 5/5 4- painful  Ankle dorsiflexion       Ankle plantarflexion       Ankle inversion       Ankle eversion        (Blank rows = not tested)   LOWER EXTREMITY SPECIAL TESTS:      FUNCTIONAL TESTS:  5 times sit to stand: 14 sec, UE pushing into armrests  Timed up and go (TUG): 17 sec, slow turning   GAIT: Distance walked: Lt antalgic patter, decreased knee extension on Lt  Assistive device utilized:  none Level of assistance: Complete Independence Comments: stairs- prefers step to pattern with Rt LE leading      TODAY'S TREATMENT:   6/26 Pt seen for aquatic therapy today.  Treatment took place in water 3.5-4.75 ft in depth at the [facility] pool. Temp of water was 91.  Pt entered/exited the pool via stairs using step through pattern with hand  rail.   *Walking forward, back and side stepping in all depths *standing ue support hand buoys in 4.0 ft: relaxed squats; squats x 10; alternating forward lunges x10; side lunge x 10 *Seated on 3rd step: STS from 3rd step x 10 unsupported; lt blue resistant band clam shells x 10; STS with sustained iso abd x8; Add sets using bb x5; STS add set x 8  - flutter kicking 2 x20; hip abd/add 2 x 20 fast *Hip hiking R/L x 12   *cycling on noodle x 6 widths *walking between exercises for recovery  Pt requires the buoyancy and hydrostatic pressure of water for support, and to offload joints by unweighting joint load by at least 50 % in navel deep water and by at least 75-80% in chest to neck deep water.  Viscosity of the water is needed for resistance of strengthening. Water current perturbations provides challenge to standing balance requiring increased core activation.    6/11: Nu-Step L1 5 min Forward 4 inch step ups 2x5 Lateral step ups painful inferior knee 5x  2 inch step downs 2 sets of 5 Single leg calf raise 2 sets of 5 KT patellar tendon 2 strips (good relief reported) Leg press seat 7 70# 10x 35# 20x HS red power cord 2x10 Hip machine 40#: abduction, extension and flexion 10x each   04/01/23 Manual:  Lt TKA mobilization STM Lt quad/adductors Prone quad stretch  Rt 90 deg, Lt 95 deg  Attempting floor to stand from tall kneeling position, discussing hand placement and benefits of airex pad for knee cushion. HEP discussed and encouraged pt to perform stretches following epsom soak     5/10: Nu-Step L1 5 min Discussion of length of time for recovery Discussed option of aquatic PT Seated blue band press out 10x 2 Seated blue band HS curls anchored around other leg 10x2 Seated blue band clams 20x 4 inch forward step ups with left 2x5 (discussed low volume of these and not on full height step)      PATIENT EDUCATION:  Education details: updates to goals/POC; HEP  Person  educated: Patient Education method: Explanation and Handouts Education comprehension: verbalized understanding   HOME EXERCISE PROGRAM Access Code: [email or web address] URL: [email or web address] Date: 04/01/2023 Prepared by: [facility] - Outpatient Rehab - [facility]  Exercises - Supine Quadriceps Stretch with Strap on Table  - 2 x daily - 7 x weekly - 10 reps - 10 seconds hold - Side Lunge Adductor Stretch  - 2 x daily - 7 x weekly - 2 sets - 30 seconds hold - Standing Terminal Knee Extension with Resistance  - 1 x daily - 7 x weekly - 2 sets - 10 reps - Seated Knee Extension Stretch with Chair  - 2 x daily - 7 x weekly - 2-3 minutes hold - Seated Leg Press with Resistance  - 1 x daily - 7 x weekly - 1-2 sets - 10 reps - Seated Hamstring Curls with Resistance  - 1 x daily - 7 x weekly - 1-2 sets - 10 reps - Seated Hip Abduction with Resistance  - 1 x daily - 7 x weekly - 1-2 sets - 10 reps - Step Up  - 1 x daily - 7 x weekly - 1-2 sets - 5 reps   ASSESSMENT:   CLINICAL IMPRESSION: Pt reports little change in pain after last session but does believe she is getting stronger and overall pain is down. Pain is exacerbated by job. Focused on knee and hip strengthening.  Progressed loading with decreased submersion of exercises which is tolerated well. Goals ongoing   OBJECTIVE IMPAIRMENTS: Abnormal gait, decreased activity tolerance, decreased endurance, decreased mobility, difficulty walking, decreased ROM, decreased strength, hypomobility, increased muscle spasms, improper body mechanics, and pain.    ACTIVITY LIMITATIONS: sitting, standing, squatting, stairs, transfers, locomotion level, and caring for others   PARTICIPATION LIMITATIONS: community activity, occupation, and yard work   PERSONAL FACTORS: Age and Fitness are also affecting patient's functional outcome.    REHAB POTENTIAL: Good   CLINICAL DECISION MAKING: Stable/uncomplicated   EVALUATION  COMPLEXITY: Low     GOALS: Goals reviewed with patient? Yes   SHORT TERM GOALS: Target date: 02/08/23 Pt will be independent with initial HEP to improve LE ROM and mobility.  Baseline: Goal status: MET       LONG TERM GOALS: Target date: 06/07/23   Pt will be able to complete TUG in less than 14 seconds. Baseline:  Goal status: INITIAL   2.  Pt will have improved pain and function, evident by her ability to complete up to a 4 hour shift at work. Baseline: 2 hours  Goal status: PARTIALLY MET   3.  Pt will report atleast 50% improvement in her knee pain from the start of PT. Baseline:  Goal status:MET   4.  Pt will have improved Lt quad strength to 5/5 MMT. Baseline: MET Goal status: INITIAL  5.  Pt will be able to ascend and descend 4 steps with or with step over step pattern 2/3 trials without pain.  Baseline: Pt will be able to ascend and descend 4 steps with or without handrail and step over step pattern Goal status: NEW       PLAN:   PT FREQUENCY: 1x/week   PT DURATION: 7 weeks   PLANNED INTERVENTIONS: Therapeutic exercises, Therapeutic activity, Neuromuscular re-education, Balance training, Gait training, Patient/Family education, Self Care, Joint mobilization, Aquatic Therapy, Dry Needling, Vasopneumatic device, Manual therapy, and Re-evaluation   PLAN FOR NEXT SESSION: check response to KT patellar tendon region;  glute, gastroc, HS, quad strength; aquatic PT trial  Corrie Dandy Tomma Lightning) Tereasa Yilmaz MPT 05/11/23 6:47 PM Phone: [phone] Fax: [phone]

## 2023-05-17 ENCOUNTER — Ambulatory Visit (HOSPITAL_BASED_OUTPATIENT_CLINIC_OR_DEPARTMENT_OTHER): Payer: Medicare Other | Attending: Orthopedic Surgery | Admitting: Physical Therapy

## 2023-05-17 ENCOUNTER — Encounter (HOSPITAL_BASED_OUTPATIENT_CLINIC_OR_DEPARTMENT_OTHER): Payer: Self-pay | Admitting: Physical Therapy

## 2023-05-17 DIAGNOSIS — M25562 Pain in left knee: Secondary | ICD-10-CM | POA: Diagnosis present

## 2023-05-17 DIAGNOSIS — M25662 Stiffness of left knee, not elsewhere classified: Secondary | ICD-10-CM | POA: Diagnosis present

## 2023-05-17 NOTE — Therapy (Signed)
OUTPATIENT PHYSICAL THERAPY TREATMENT NOTE   Patient Name: Madison Tate MRN: [id number] DOB:11-Feb-1951, 72 y.o., female Today's Date: 05/17/2023  PCP: General REFERRING PROVIDER: Myrene Galas, MD   END OF SESSION:   PT End of Session - 05/17/23 1444     Visit Number 12    Date for PT Re-Evaluation 06/07/23    Authorization Type Medicare A and B    Authorization Time Period NEW 03/30/23 to 06/07/23/Recert completed on visit 8. Progress  ote due vist #18    Progress Note Due on Visit 18    PT Start Time 1444    PT Stop Time 1525    PT Time Calculation (min) 41 min                  Past Medical History:  Diagnosis Date   Anxiety    Diabetes mellitus without complication (HCC)    Hypercholesteremia    Hypertension    TIA (transient ischemic attack)    Past Surgical History:  Procedure Laterality Date   ABDOMINAL HYSTERECTOMY     There are no problems to display for this patient.   REFERRING DIAG: Medial meniscus Lt knee   THERAPY DIAG:  Acute pain of left knee  Stiffness of left knee, not elsewhere classified  Rationale for Evaluation and Treatment Rehabilitation  PERTINENT HISTORY: DM  PRECAUTIONS:None  SUBJECTIVE:                                                                                                                                                                                      SUBJECTIVE STATEMENT:  "Last week I did much better after last visit.  Pain down to a 2/10 and stayed down for 48 hours."  Pt reports she has jury duty next week (in [facility]).   She reports she likely won't use pool for exercise after d/c (limited access, etc).    PAIN:  Are you having pain? 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    PATIENT SURVEYS:  FOTO 42, 63 goal 04/01/23: 58   COGNITION: Overall cognitive status: Within functional limits for tasks assessed                         SENSATION: Denies numbness/tingling     MUSCLE LENGTH:   Maisie Fus test: Right  deg; Left 90 deg (+) pain Prone ely test 04/01/23: Lt 95 deg, Rt 90  POSTURE: weight shift right   PALPATION: Muscle spasm Lt quad, Lt adductor, medial knee    LOWER EXTREMITY ROM:   Active ROM Right eval Left eval  Hip flexion      Hip extension      Hip abduction      Hip adduction      Hip internal rotation      Hip external rotation      Knee flexion 140 125  Knee extension 0 -5  Ankle dorsiflexion      Ankle plantarflexion      Ankle inversion      Ankle eversion       (Blank rows = not tested) 02/25/23: Lt knee flexion: 130 deg, extension lacking 5 deg 04/01/23: Lt knee flexion 140 deg, extension 0 deg  LOWER EXTREMITY MMT:   MMT Right eval Left eval Left 6/11  Hip flexion 5 5   Hip extension 5 5   Hip abduction 5 5   Hip adduction       Hip internal rotation       Hip external rotation       Knee flexion       Knee extension 5 3 (+) pain 04/01/23 5/5 4- painful  Ankle dorsiflexion       Ankle plantarflexion       Ankle inversion       Ankle eversion        (Blank rows = not tested)   LOWER EXTREMITY SPECIAL TESTS:      FUNCTIONAL TESTS:  5 times sit to stand: 14 sec, UE pushing into armrests  Timed up and go (TUG): 17 sec, slow turning   GAIT: Distance walked: Lt antalgic patter, decreased knee extension on Lt  Assistive device utilized:  none Level of assistance: Complete Independence Comments: stairs- prefers step to pattern with Rt LE leading      TODAY'S TREATMENT:  7/4 Pt seen for aquatic therapy today.  Treatment took place in water 3.5-4.75 ft in depth at the [facility] pool. Temp of water was 91.  Pt entered/exited the pool via stairs  using step-to pattern with hand rail.  * at 4 ft depth - Walking forward/ backward with neutral trunk and quiet arms;  marching forward/ backward *  side stepping R/L, varying step height * Seated on bench - flutter kicking 2 x20; hip abd/add 2 x 20 fast, cycling LEs - lt blue resistant band clam shells x 15;  -orange ball squeezes x 5 sec x 5 - STS with 4 sec eccentric lowering down to bench with feet on blue step * holding wall:  relaxed squats x 10 * return to walking forward/ backward  *cycling on noodle x 6 widths  6/26 Pt seen for aquatic therapy today.  Treatment took place in water 3.5-4.75 ft in depth at the [facility] pool. Temp of water was 91.  Pt entered/exited the pool via stairs using step through pattern with hand rail.   *Walking forward, back and side stepping in all depths *standing ue support hand buoys in 4.0 ft: relaxed squats; squats x 10; alternating forward lunges x10; side lunge x 10 *Seated on 3rd step: STS from 3rd step x 10 unsupported; lt blue resistant band clam shells x 10; STS with sustained iso abd x8; Add sets using bb x5; STS add set x 8  - flutter kicking 2 x20; hip abd/add 2 x 20 fast *Hip hiking R/L x 12   *cycling on noodle  x 6 widths *walking between exercises for recovery  Pt requires the buoyancy and hydrostatic pressure of water for support, and to offload joints by unweighting joint load by at least 50 % in navel deep water and by at least 75-80% in chest to neck deep water.  Viscosity of the water is needed for resistance of strengthening. Water current perturbations provides challenge to standing balance requiring increased core activation.    6/11: Nu-Step L1 5 min Forward 4 inch step ups 2x5 Lateral step ups painful inferior knee 5x  2 inch step downs 2 sets of 5 Single leg calf raise 2 sets of 5 KT patellar tendon 2 strips (good relief reported) Leg press seat 7 70# 10x 35# 20x HS red power cord 2x10 Hip machine 40#:  abduction, extension and flexion 10x each   04/01/23 Manual:  Lt TKA mobilization STM Lt quad/adductors Prone quad stretch Rt 90 deg, Lt 95 deg  Attempting floor to stand from tall kneeling position, discussing hand placement and benefits of airex pad for knee cushion. HEP discussed and encouraged pt to perform stretches following epsom soak     5/10: Nu-Step L1 5 min Discussion of length of time for recovery Discussed option of aquatic PT Seated blue band press out 10x 2 Seated blue band HS curls anchored around other leg 10x2 Seated blue band clams 20x 4 inch forward step ups with left 2x5 (discussed low volume of these and not on full height step)      PATIENT EDUCATION:  Education details: updates to goals/POC; HEP  Person educated: Patient Education method: Explanation and Handouts Education comprehension: verbalized understanding   HOME EXERCISE PROGRAM Access Code: [email or web address] URL: [email or web address] Date: 04/01/2023 Prepared by: [facility] - Outpatient Rehab - [facility]  Exercises - Supine Quadriceps Stretch with Strap on Table  - 2 x daily - 7 x weekly - 10 reps - 10 seconds hold - Side Lunge Adductor Stretch  - 2 x daily - 7 x weekly - 2 sets - 30 seconds hold - Standing Terminal Knee Extension with Resistance  - 1 x daily - 7 x weekly - 2 sets - 10 reps - Seated Knee Extension Stretch with Chair  - 2 x daily - 7 x weekly - 2-3 minutes hold - Seated Leg Press with Resistance  - 1 x daily - 7 x weekly - 1-2 sets - 10 reps - Seated Hamstring Curls with Resistance  - 1 x daily - 7 x weekly - 1-2 sets - 10 reps - Seated Hip Abduction with Resistance  - 1 x daily - 7 x weekly - 1-2 sets - 10 reps - Step Up  - 1 x daily - 7 x weekly - 1-2 sets - 5 reps   ASSESSMENT:   CLINICAL IMPRESSION: Positive response to last aquatic session with overall pain level decreased for 48+ hrs.  Pt reported that pain remained unchanged during session.   Positive response to tape application to Lt knee; plan to teach self taping technique at last land session, if continued relief.  Pt continues to report pain is exacerbated by job. Goals ongoing   OBJECTIVE IMPAIRMENTS: Abnormal gait, decreased activity tolerance, decreased endurance, decreased mobility, difficulty walking, decreased ROM, decreased strength, hypomobility, increased muscle spasms, improper body mechanics, and pain.    ACTIVITY LIMITATIONS: sitting, standing, squatting, stairs, transfers, locomotion level, and caring for others   PARTICIPATION LIMITATIONS: community activity, occupation, and yard work   PERSONAL FACTORS: Age and Fitness are  also affecting patient's functional outcome.    REHAB POTENTIAL: Good   CLINICAL DECISION MAKING: Stable/uncomplicated   EVALUATION COMPLEXITY: Low     GOALS: Goals reviewed with patient? Yes   SHORT TERM GOALS: Target date: 02/08/23 Pt will be independent with initial HEP to improve LE ROM and mobility.  Baseline: Goal status: MET       LONG TERM GOALS: Target date: 06/07/23   Pt will be able to complete TUG in less than 14 seconds. Baseline:  Goal status: INITIAL   2.  Pt will have improved pain and function, evident by her ability to complete up to a 4 hour shift at work. Baseline: 2 hours  Goal status: PARTIALLY MET   3.  Pt will report atleast 50% improvement in her knee pain from the start of PT. Baseline:  Goal status:MET   4.  Pt will have improved Lt quad strength to 5/5 MMT. Baseline: MET Goal status: INITIAL   5.  Pt will be able to ascend and descend 4 steps with or with step over step pattern 2/3 trials without pain.  Baseline: Pt will be able to ascend and descend 4 steps with or without handrail and step over step pattern Goal status: NEW       PLAN:   PT FREQUENCY: 1x/week   PT DURATION: 7 weeks   PLANNED INTERVENTIONS: Therapeutic exercises, Therapeutic activity, Neuromuscular re-education,  Balance training, Gait training, Patient/Family education, Self Care, Joint mobilization, Aquatic Therapy, Dry Needling, Vasopneumatic device, Manual therapy, and Re-evaluation   PLAN FOR NEXT SESSION: check response to KT tape to patellar tendon region; continue strengthening to  glute, gastroc, HS, quad strength;   Mayer Camel, PTA 05/17/23 3:32 PM [facility] [address] [place], [place], [address] Phone: [phone]   Fax:  [phone]

## 2023-05-23 ENCOUNTER — Encounter: Payer: Self-pay | Admitting: Physical Therapy

## 2023-05-24 ENCOUNTER — Ambulatory Visit (HOSPITAL_BASED_OUTPATIENT_CLINIC_OR_DEPARTMENT_OTHER): Payer: Self-pay | Admitting: Physical Therapy

## 2023-05-31 ENCOUNTER — Encounter (HOSPITAL_BASED_OUTPATIENT_CLINIC_OR_DEPARTMENT_OTHER): Payer: Self-pay | Admitting: Physical Therapy

## 2023-05-31 ENCOUNTER — Ambulatory Visit (HOSPITAL_BASED_OUTPATIENT_CLINIC_OR_DEPARTMENT_OTHER): Payer: Medicare Other | Admitting: Physical Therapy

## 2023-05-31 DIAGNOSIS — M25562 Pain in left knee: Secondary | ICD-10-CM

## 2023-05-31 DIAGNOSIS — M25662 Stiffness of left knee, not elsewhere classified: Secondary | ICD-10-CM

## 2023-05-31 NOTE — Therapy (Addendum)
OUTPATIENT PHYSICAL THERAPY TREATMENT NOTE   Patient Name: Madison Tate MRN: [id number] DOB:1951/03/21, 72 y.o., female Today's Date: 05/31/2023  PCP: General REFERRING PROVIDER: Myrene Galas, MD   END OF SESSION:   PT End of Session - 05/31/23 1535     Visit Number 13    Date for PT Re-Evaluation 06/07/23    Authorization Type Medicare A and B    Authorization Time Period NEW 03/30/23 to 06/07/23/Recert completed on visit 8. Progress  ote due vist #[id number]    Progress Note Due on Visit 18    PT Start Time 1531    PT Stop Time 1615    PT Time Calculation (min) 44 min    Activity Tolerance Patient tolerated treatment well    Behavior During Therapy WFL for tasks assessed/performed                  Past Medical History:  Diagnosis Date   Anxiety    Diabetes mellitus without complication (HCC)    Hypercholesteremia    Hypertension    TIA (transient ischemic attack)    Past Surgical History:  Procedure Laterality Date   ABDOMINAL HYSTERECTOMY     There are no problems to display for this patient.   REFERRING DIAG: Medial meniscus Lt knee   THERAPY DIAG:  Acute pain of left knee  Stiffness of left knee, not elsewhere classified  Rationale for Evaluation and Treatment Rehabilitation  PERTINENT HISTORY: DM  PRECAUTIONS:None  SUBJECTIVE:                                                                                                                                                                                      SUBJECTIVE STATEMENT:  "Good day, pain is low, last visit" She reports she likely won't use pool for exercise after d/c (limited access, etc).    PAIN:  Are you having pain? 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    PATIENT SURVEYS:  FOTO 42, 63 goal 04/01/23: 58   COGNITION: Overall cognitive status: Within functional limits for tasks assessed                         SENSATION: Denies numbness/tingling     MUSCLE LENGTH:   Maisie Fus test: Right  deg; Left 90 deg (+) pain Prone ely test 04/01/23: Lt 95 deg, Rt 90   POSTURE: weight shift right   PALPATION: Muscle spasm  Lt quad, Lt adductor, medial knee    LOWER EXTREMITY ROM:   Active ROM Right eval Left eval  Hip flexion      Hip extension      Hip abduction      Hip adduction      Hip internal rotation      Hip external rotation      Knee flexion 140 125  Knee extension 0 -5  Ankle dorsiflexion      Ankle plantarflexion      Ankle inversion      Ankle eversion       (Blank rows = not tested) 02/25/23: Lt knee flexion: 130 deg, extension lacking 5 deg 04/01/23: Lt knee flexion 140 deg, extension 0 deg  LOWER EXTREMITY MMT:   MMT Right eval Left eval Left 6/11  Hip flexion 5 5   Hip extension 5 5   Hip abduction 5 5   Hip adduction       Hip internal rotation       Hip external rotation       Knee flexion       Knee extension 5 3 (+) pain 04/01/23 5/5 4- painful  Ankle dorsiflexion       Ankle plantarflexion       Ankle inversion       Ankle eversion        (Blank rows = not tested)   LOWER EXTREMITY SPECIAL TESTS:      FUNCTIONAL TESTS:  5 times sit to stand: 14 sec, UE pushing into armrests  Timed up and go (TUG): 17 sec, slow turning   GAIT: Distance walked: Lt antalgic patter, decreased knee extension on Lt  Assistive device utilized:  none Level of assistance: Complete Independence Comments: stairs- prefers step to pattern with Rt LE leading      TODAY'S TREATMENT:  05/31/23  Pt seen for aquatic therapy today.  Treatment took place in water 3.5-4.75 ft in depth at the [facility] pool. Temp of water was 91.  Pt entered/exited the pool via  stairs using step-to pattern with hand rail.  Exercises - Side lunge with hand buoys   - Sit to Stand   - Squat   - Water Step Up on Bottom Step   - Standing Hip Hinge  - Flutter Kicking/Windshield Wipers   - Alternating Forward Lunge  - Alternating Backward Lunge   - Seated Straddle on Flotation Forward Breast Stroke Arms and Bicycle Legs   Pt requires the buoyancy and hydrostatic pressure of water for support, and to offload joints by unweighting joint load by at least 50 % in navel deep water and by at least 75-80% in chest to neck deep water.  Viscosity of the water is needed for resistance of strengthening. Water current perturbations provides challenge to standing balance requiring increased core activation.  After dried off:  applied reg Rock tape to Lt knee in 2 I-strips, med/ lateral joint crossing at tibial tuberosity/patellar tendon with 25% stretch - to decompress tissue and increase proprioception.    7/2 Pt seen for aquatic therapy today.  Treatment took place in water 3.5-4.75 ft in depth at the [facility] pool. Temp of water was 91.  Pt entered/exited the pool via stairs using step-to pattern with hand rail.  * at 4 ft depth - Walking forward/ backward with neutral trunk and quiet arms;  marching forward/ backward *  side stepping R/L, varying step height * Seated on bench - flutter kicking  2 x20; hip abd/add 2 x 20 fast, cycling LEs - lt blue resistant band clam shells x 15;  -orange ball squeezes x 5 sec x 5 - STS with 4 sec eccentric lowering down to bench with feet on blue step * holding wall:  relaxed squats x 10 * return to walking forward/ backward  *cycling on noodle x 6 widths  After dried off:  applied reg Rock tape to Lt knee in 2 I-strips, med/ lateral joint crossing at tibial tuberosity/patellar tendon with 25% stretch - to decompress tissue and increase proprioception.   6/26 Pt seen for aquatic therapy today.  Treatment took place in water  3.5-4.75 ft in depth at the [facility] pool. Temp of water was 91.  Pt entered/exited the pool via stairs using step through pattern with hand rail.   *Walking forward, back and side stepping in all depths *standing ue support hand buoys in 4.0 ft: relaxed squats; squats x 10; alternating forward lunges x10; side lunge x 10 *Seated on 3rd step: STS from 3rd step x 10 unsupported; lt blue resistant band clam shells x 10; STS with sustained iso abd x8; Add sets using bb x5; STS add set x 8  - flutter kicking 2 x20; hip abd/add 2 x 20 fast *Hip hiking R/L x 12   *cycling on noodle x 6 widths *walking between exercises for recovery  Pt requires the buoyancy and hydrostatic pressure of water for support, and to offload joints by unweighting joint load by at least 50 % in navel deep water and by at least 75-80% in chest to neck deep water.  Viscosity of the water is needed for resistance of strengthening. Water current perturbations provides challenge to standing balance requiring increased core activation.    6/11: Nu-Step L1 5 min Forward 4 inch step ups 2x5 Lateral step ups painful inferior knee 5x  2 inch step downs 2 sets of 5 Single leg calf raise 2 sets of 5 KT patellar tendon 2 strips (good relief reported) Leg press seat 7 70# 10x 35# 20x HS red power cord 2x10 Hip machine 40#: abduction, extension and flexion 10x each   04/01/23 Manual:  Lt TKA mobilization STM Lt quad/adductors Prone quad stretch Rt 90 deg, Lt 95 deg  Attempting floor to stand from tall kneeling position, discussing hand placement and benefits of airex pad for knee cushion. HEP discussed and encouraged pt to perform stretches following epsom soak     5/10: Nu-Step L1 5 min Discussion of length of time for recovery Discussed option of aquatic PT Seated blue band press out 10x 2 Seated blue band HS curls anchored around other leg 10x2 Seated blue band clams 20x 4 inch forward step ups with  left 2x5 (discussed low volume of these and not on full height step)      PATIENT EDUCATION:  Education details: updates to goals/POC; HEP  Person educated: Patient Education method: Explanation and Handouts Education comprehension: verbalized understanding   HOME EXERCISE PROGRAM Access Code: [email or web address] URL: [email or web address] Date: 04/01/2023 Prepared by: [facility]  Exercises - Supine Quadriceps Stretch with Strap on Table  - 2 x daily - 7 x weekly - 10 reps - 10 seconds hold - Side Lunge Adductor Stretch  - 2 x daily - 7 x weekly - 2 sets - 30 seconds hold - Standing Terminal Knee Extension with Resistance  - 1 x daily - 7 x weekly - 2 sets -  10 reps - Seated Knee Extension Stretch with Chair  - 2 x daily - 7 x weekly - 2-3 minutes hold - Seated Leg Press with Resistance  - 1 x daily - 7 x weekly - 1-2 sets - 10 reps - Seated Hamstring Curls with Resistance  - 1 x daily - 7 x weekly - 1-2 sets - 10 reps - Seated Hip Abduction with Resistance  - 1 x daily - 7 x weekly - 1-2 sets - 10 reps - Step Up  - 1 x daily - 7 x weekly - 1-2 sets - 5 reps   AQUATIC This aquatic home exercise program from [employer] utilizes pictures from land based exercises, but has been adapted prior to lamination and issuance.   Access Code: [email or web address] URL: [email or web address] Date: 05/31/2023 Prepared by: Geni Bers  Exercises - Side lunge with hand buoys  - 1 reps - Sit to Stand  - 1 x daily - 1-3 x weekly - 1-3 sets - 10 reps - Squat  - 1 x daily - 1-3 x weekly - 1-3 sets - 10 reps - Water Step Up on Bottom Step  - 1 x daily - 1-3 x weekly - 1-3 sets - 10 reps - Standing Hip Hinge  - 1 x daily - 1-3 x weekly - 1-3 sets - 10 reps - Flutter Kicking/Windshield Wipers  - 1 x daily - 1-3 x weekly - 2-3 sets - 10-20 reps - Alternating Forward Lunge  - 1 x daily - 1-3 x weekly - 1-3 sets - 10 reps - Alternating Backward  Lunge  - 1 x daily - 1-3 x weekly - 1-3 sets - 10 reps - Seated Straddle on Flotation Forward Breast Stroke Arms and Bicycle Legs    ASSESSMENT:   CLINICAL IMPRESSION:   Pt reports with decreased overall pain.  She is instructed and then issued final laminated aquatic HEP.  As of now she is insure if she will continue with aquatic exercise on her own but did want the option to have a program in event she does.  She completes all tasks well, no pain, no difficulty.  Requires minor cues for execution and minor written clarifications on program.  She has reached her max potential in this setting  Applied reg Rock tape - 2nd application to pt's left knee.  Positive response in past.  Will plan to teach how to apply next visit, if pt is interested.   OBJECTIVE IMPAIRMENTS: Abnormal gait, decreased activity tolerance, decreased endurance, decreased mobility, difficulty walking, decreased ROM, decreased strength, hypomobility, increased muscle spasms, improper body mechanics, and pain.    ACTIVITY LIMITATIONS: sitting, standing, squatting, stairs, transfers, locomotion level, and caring for others   PARTICIPATION LIMITATIONS: community activity, occupation, and yard work   PERSONAL FACTORS: Age and Fitness are also affecting patient's functional outcome.    REHAB POTENTIAL: Good   CLINICAL DECISION MAKING: Stable/uncomplicated   EVALUATION COMPLEXITY: Low     GOALS: Goals reviewed with patient? Yes   SHORT TERM GOALS: Target date: 02/08/23 Pt will be independent with initial HEP to improve LE ROM and mobility.  Baseline: Goal status: MET       LONG TERM GOALS: Target date: 06/07/23   Pt will be able to complete TUG in less than 14 seconds. Baseline:  Goal status: INITIAL   2.  Pt will have improved pain and function, evident by her ability to complete up to a 4 hour shift at work. Baseline: 2 hours  Goal status: PARTIALLY MET   3.  Pt will report atleast 50% improvement in her  knee pain from the start of PT. Baseline:  Goal status:MET   4.  Pt will have improved Lt quad strength to 5/5 MMT. Baseline: MET Goal status: INITIAL   5.  Pt will be able to ascend and descend 4 steps with or with step over step pattern 2/3 trials without pain.  Baseline: Pt will be able to ascend and descend 4 steps with or without handrail and step over step pattern Goal status: NEW       PLAN:   PT FREQUENCY: 1x/week   PT DURATION: 7 weeks   PLANNED INTERVENTIONS: Therapeutic exercises, Therapeutic activity, Neuromuscular re-education, Balance training, Gait training, Patient/Family education, Self Care, Joint mobilization, Aquatic Therapy, Dry Needling, Vasopneumatic device, Manual therapy, and Re-evaluation   PLAN FOR NEXT SESSION: last scheduled visit/ re-assessment vs DC  Madison Tate) Madison Tate MPT 05/31/23 3:37 PM [facility] [address] [place], [place], [address] Phone: [phone]   Fax:  [phone]  Mayer Camel, PTA 05/31/23 6:07 PM [facility] [address] [place], [place], [address] Phone: [phone]   Fax:  [phone]

## 2023-06-07 ENCOUNTER — Ambulatory Visit: Payer: Medicare Other | Attending: Orthopedic Surgery | Admitting: Physical Therapy

## 2023-06-07 DIAGNOSIS — M25562 Pain in left knee: Secondary | ICD-10-CM | POA: Diagnosis present

## 2023-06-07 DIAGNOSIS — M25662 Stiffness of left knee, not elsewhere classified: Secondary | ICD-10-CM

## 2023-06-07 NOTE — Therapy (Signed)
OUTPATIENT PHYSICAL THERAPY TREATMENT NOTE/RECERTIFICATION   Patient Name: Madison Tate MRN: [id number] DOB:1951-03-08, 72 y.o., female Today's Date: 06/07/2023  PCP: General REFERRING PROVIDER: Myrene Galas, MD   END OF SESSION:   PT End of Session - 06/07/23 1355     Visit Number 14    Date for PT Re-Evaluation 08/02/23    Authorization Type Medicare A and B    Progress Note Due on Visit 20    PT Start Time 1400    PT Stop Time 1440    PT Time Calculation (min) 40 min    Activity Tolerance Patient tolerated treatment well                  Past Medical History:  Diagnosis Date   Anxiety    Diabetes mellitus without complication (HCC)    Hypercholesteremia    Hypertension    TIA (transient ischemic attack)    Past Surgical History:  Procedure Laterality Date   ABDOMINAL HYSTERECTOMY     There are no problems to display for this patient.   REFERRING DIAG: Medial meniscus Lt knee   THERAPY DIAG:  Acute pain of left knee  Stiffness of left knee, not elsewhere classified  Rationale for Evaluation and Treatment Rehabilitation  PERTINENT HISTORY: DM  PRECAUTIONS:None  SUBJECTIVE:                                                                                                                                                                                      SUBJECTIVE STATEMENT:  The last few days have been yucky.  I definitely better than I was when I started.  I was an 8/10 at the start. Now 3/10.  I've been doing more stairs at my boyfriend's house taking care of him.   She reports she likely won't use pool for exercise after d/c (limited access, etc).    PAIN:  Are you having pain? Yes: NPRS scale: 3/10 Pain location: Lt knee Pain description: sore Aggravating factors: Being up on it Relieving factors: Not messing with it  The Patient-Specific Functional Scale  Initial:  I am going to ask you to identify up to 3 important activities that you  are unable to do or are having difficulty with as a result of this problem.  Today are there any activities that you are unable to do or having difficulty with because of this?  (Patient shown scale and patient rated each activity)  Follow up: When you first came in you had difficulty performing these activities.  Today do you still have difficulty?  Patient-Specific activity scoring scheme (Point to one number):  0 1 2 3  4 5 6 7 8 9 10  Unable                                                                                                          Able to perform To perform                                                                                                    activity at the same Activity         Level as before                                                                                                                       Injury or problem  Activity   Able to flight of stairs                                                                              7/23:      6                   2.        Difficulty with riding lawnmower pushing in the pedal and hold while shifting 7/23:7                                                              3.           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    PATIENT SURVEYS:  FOTO 42, 63 goal 04/01/23: 58 06/07/23: 58%   COGNITION: Overall cognitive status: Within functional limits for tasks assessed                         SENSATION: Denies numbness/tingling     MUSCLE LENGTH:   Maisie Fus test: Right  deg; Left 90 deg (+) pain Prone ely test 04/01/23: Lt 95 deg, Rt 90   POSTURE: weight shift right   PALPATION: Muscle spasm Lt quad, Lt adductor, medial knee    LOWER EXTREMITY ROM:    Active ROM Right eval Left eval  Hip flexion      Hip extension      Hip abduction      Hip adduction      Hip internal rotation      Hip external rotation      Knee flexion 140 125  Knee extension 0 -5  Ankle dorsiflexion      Ankle plantarflexion      Ankle inversion      Ankle eversion       (Blank rows = not tested) 02/25/23: Lt knee flexion: 130 deg, extension lacking 5 deg 04/01/23: Lt knee flexion 140 deg, extension 0 deg 06/07/23: 0-135 active    LOWER EXTREMITY MMT:   MMT Right eval Left eval Left 6/11 7/23  Hip flexion 5 5    Hip extension 5 5    Hip abduction 5 5    Hip adduction        Hip internal rotation        Hip external rotation        Knee flexion      4+  Knee extension 5 3 (+) pain 04/01/23 5/5 4- painful 4  Ankle dorsiflexion        Ankle plantarflexion        Ankle inversion        Ankle eversion         (Blank rows = not tested)   LOWER EXTREMITY SPECIAL TESTS:      FUNCTIONAL TESTS:  5 times sit to stand: 14 sec, UE pushing into armrests  Timed up and go (TUG): 17 sec, slow turning 7/23: 5x sit to stand: 12.54 no arms TUG: 8.72 no arms   GAIT: Distance walked: Lt antalgic patter, decreased knee extension on Lt  Assistive device utilized:  none Level of assistance: Complete Independence Comments: stairs- prefers step to pattern with Rt LE leading      TODAY'S TREATMENT:  7/23: Nu-Step L5 10 min FOTO TUG  5x sit to stand Up and down 4 steps x2 with 1 railing min use reciprocally 2/10 pain rating Patient specific functional scale for goal setting KT patellar tendon 2 strips (good relief reported) patient took pictures/video for home trial Leg press seat 7 80# 10x 35# 20x   05/31/23  Pt seen for aquatic therapy today.  Treatment took place in water 3.5-4.75 ft in depth at the [facility] pool. Temp of water was 91.  Pt entered/exited the pool via stairs using step-to pattern with hand rail.  Exercises - Side  lunge with hand buoys   - Sit to Stand   -  Squat   - Water Step Up on Bottom Step   - Standing Hip Hinge  - Flutter Kicking/Windshield Wipers   - Alternating Forward Lunge  - Alternating Backward Lunge   - Seated Straddle on Flotation Forward Breast Stroke Arms and Bicycle Legs   Pt requires the buoyancy and hydrostatic pressure of water for support, and to offload joints by unweighting joint load by at least 50 % in navel deep water and by at least 75-80% in chest to neck deep water.  Viscosity of the water is needed for resistance of strengthening. Water current perturbations provides challenge to standing balance requiring increased core activation.  After dried off:  applied reg Rock tape to Lt knee in 2 I-strips, med/ lateral joint crossing at tibial tuberosity/patellar tendon with 25% stretch - to decompress tissue and increase proprioception.    After dried off:  applied reg Rock tape to Lt knee in 2 I-strips, med/ lateral joint crossing at tibial tuberosity/patellar tendon with 25% stretch - to decompress tissue and increase proprioception.    6/11: Nu-Step L1 5 min Forward 4 inch step ups 2x5 Lateral step ups painful inferior knee 5x  2 inch step downs 2 sets of 5 Single leg calf raise 2 sets of 5 KT patellar tendon 2 strips (good relief reported) Leg press seat 7 70# 10x 35# 20x HS red power cord 2x10 Hip machine 40#: abduction, extension and flexion 10x each   04/01/23 Manual:  Lt TKA mobilization STM Lt quad/adductors Prone quad stretch Rt 90 deg, Lt 95 deg  Attempting floor to stand from tall kneeling position, discussing hand placement and benefits of airex pad for knee cushion. HEP discussed and encouraged pt to perform stretches following epsom soak     5/10: Nu-Step L1 5 min Discussion of length of time for recovery Discussed option of aquatic PT Seated blue band press out 10x 2 Seated blue band HS curls anchored around other leg 10x2 Seated blue band  clams 20x 4 inch forward step ups with left 2x5 (discussed low volume of these and not on full height step)      PATIENT EDUCATION:  Education details: updates to goals/POC; HEP  Person educated: Patient Education method: Explanation and Handouts Education comprehension: verbalized understanding   HOME EXERCISE PROGRAM Access Code: [email or web address] URL: [email or web address] Date: 04/01/2023 Prepared by: [facility] - Outpatient Rehab - [facility]  Exercises - Supine Quadriceps Stretch with Strap on Table  - 2 x daily - 7 x weekly - 10 reps - 10 seconds hold - Side Lunge Adductor Stretch  - 2 x daily - 7 x weekly - 2 sets - 30 seconds hold - Standing Terminal Knee Extension with Resistance  - 1 x daily - 7 x weekly - 2 sets - 10 reps - Seated Knee Extension Stretch with Chair  - 2 x daily - 7 x weekly - 2-3 minutes hold - Seated Leg Press with Resistance  - 1 x daily - 7 x weekly - 1-2 sets - 10 reps - Seated Hamstring Curls with Resistance  - 1 x daily - 7 x weekly - 1-2 sets - 10 reps - Seated Hip Abduction with Resistance  - 1 x daily - 7 x weekly - 1-2 sets - 10 reps - Step Up  - 1 x daily - 7 x weekly - 1-2 sets - 5 reps   AQUATIC This aquatic home exercise program from [employer] utilizes pictures from land based exercises, but has been adapted  prior to lamination and issuance.   Access Code: [email or web address] URL: [email or web address] Date: 05/31/2023 Prepared by: Geni Bers  Exercises - Side lunge with hand buoys  - 1 reps - Sit to Stand  - 1 x daily - 1-3 x weekly - 1-3 sets - 10 reps - Squat  - 1 x daily - 1-3 x weekly - 1-3 sets - 10 reps - Water Step Up on Bottom Step  - 1 x daily - 1-3 x weekly - 1-3 sets - 10 reps - Standing Hip Hinge  - 1 x daily - 1-3 x weekly - 1-3 sets - 10 reps - Flutter Kicking/Windshield Wipers  - 1 x daily - 1-3 x weekly - 2-3 sets - 10-20 reps - Alternating Forward Lunge  - 1 x daily - 1-3 x weekly - 1-3  sets - 10 reps - Alternating Backward Lunge  - 1 x daily - 1-3 x weekly - 1-3 sets - 10 reps - Seated Straddle on Flotation Forward Breast Stroke Arms and Bicycle Legs    ASSESSMENT:   CLINICAL IMPRESSION:   The patient has significantly improved with pain reduction from 8/10 at start of care to a 3/10. Self rates her overall progress at 75-80%. She has also improved in TUG and 5x sit to stand tests.  She reports pain relief for work and home with patellar tendon taping with KT and instructed her on self application.  She continues to have difficulty with repeated stair climbing, using the mower pedals and with walking longer periods of time.  The patient would benefit from a continuation of skilled PT for a further progression of strengthening and functional mobility.  Will continue to update and promote independence in a HEP needed for a return to the highest functional level possible with ADLs.       OBJECTIVE IMPAIRMENTS: Abnormal gait, decreased activity tolerance, decreased endurance, decreased mobility, difficulty walking, decreased ROM, decreased strength, hypomobility, increased muscle spasms, improper body mechanics, and pain.    ACTIVITY LIMITATIONS: sitting, standing, squatting, stairs, transfers, locomotion level, and caring for others   PARTICIPATION LIMITATIONS: community activity, occupation, and yard work   PERSONAL FACTORS: Age and Fitness are also affecting patient's functional outcome.    REHAB POTENTIAL: Good   CLINICAL DECISION MAKING: Stable/uncomplicated   EVALUATION COMPLEXITY: Low     GOALS: Goals reviewed with patient? Yes   SHORT TERM GOALS: Target date: 02/08/23 Pt will be independent with initial HEP to improve LE ROM and mobility.  Baseline: Goal status: MET       LONG TERM GOALS: Target date: 08/02/23   Pt will be able to complete TUG in less than 14 seconds. Baseline:  Goal status: met 7/23   2.  Pt will have improved pain and function,  evident by her ability to complete up to a 4 hour shift at work. Baseline: 2 hours  Goal status: met 7/23   3.  Pt will report atleast 50% improvement in her knee pain from the start of PT. Baseline:  Goal status:MET   4.  Pt will have improved Lt quad strength to 5/5 MMT. Baseline: MET Goal status: ongoing   5.  Pt will be able to ascend and descend 4 steps with or with step over step pattern 2/3 trials without pain.  Baseline: Pt will be able to ascend and descend 4 steps with or without handrail and step over step pattern Goal status:  met 7/23  6. Able to ascend/descend flight of steps multiple  times (boyfriend's house) with PSFS rating of 8/10 NEW 7. Improved LE strength need to press the brake pedal on mower with PSFS rating of 8/10 NEW 8. Patient able to walk an  hour at work with PSFS rating of 8/10 NEW      PLAN:   PT FREQUENCY: 1x/week   PT DURATION: 8 weeks   PLANNED INTERVENTIONS: Therapeutic exercises, Therapeutic activity, Neuromuscular re-education, Balance training, Gait training, Patient/Family education, Self Care, Joint mobilization, Aquatic Therapy, Dry Needling, Vasopneumatic device, Manual therapy, and Re-evaluation   PLAN FOR NEXT SESSION: try Spanish squats; follow up on patellar tendon taping with KT, leg press, step ups; step downs  Lavinia Sharps, PT 06/07/23 5:40 PM Phone: [phone] Fax: [phone]

## 2023-06-23 ENCOUNTER — Ambulatory Visit: Payer: Medicare Other | Admitting: Physical Therapy

## 2023-06-30 ENCOUNTER — Encounter: Payer: Medicare Other | Admitting: Physical Therapy

## 2023-07-07 ENCOUNTER — Ambulatory Visit: Payer: Medicare Other | Attending: Orthopedic Surgery | Admitting: Physical Therapy

## 2023-07-07 DIAGNOSIS — M25562 Pain in left knee: Secondary | ICD-10-CM | POA: Insufficient documentation

## 2023-07-07 DIAGNOSIS — M25662 Stiffness of left knee, not elsewhere classified: Secondary | ICD-10-CM | POA: Insufficient documentation

## 2023-07-07 NOTE — Therapy (Signed)
OUTPATIENT PHYSICAL THERAPY TREATMENT NOTE/DISCHARGE SUMMARY   Patient Name: Madison Tate MRN: [id number] DOB:1950-12-20, 72 y.o., female Today's Date: 07/07/2023  PCP: General REFERRING PROVIDER: Myrene Galas, MD   END OF SESSION:   PT End of Session - 07/07/23 1445     Visit Number 15    Date for PT Re-Evaluation 08/02/23    Authorization Type Medicare A and B    Authorization Time Period NEW 03/30/23 to 06/07/23/Recert completed on visit 8. Progress  ote due vist #[id number]    Progress Note Due on Visit 20    PT Start Time 1445    PT Stop Time 1525    PT Time Calculation (min) 40 min    Activity Tolerance Patient tolerated treatment well                  Past Medical History:  Diagnosis Date   Anxiety    Diabetes mellitus without complication (HCC)    Hypercholesteremia    Hypertension    TIA (transient ischemic attack)    Past Surgical History:  Procedure Laterality Date   ABDOMINAL HYSTERECTOMY     There are no problems to display for this patient.   REFERRING DIAG: Medial meniscus Lt knee   THERAPY DIAG:  Acute pain of left knee  Stiffness of left knee, not elsewhere classified  Rationale for Evaluation and Treatment Rehabilitation  PERTINENT HISTORY: DM  PRECAUTIONS:None  SUBJECTIVE:                                                                                                                                                                                      SUBJECTIVE STATEMENT:  I'm doing much better.  Pain 1-2/10.  A little zap to 4/10 but goes away.  Using taping at home.  Using a tub at home to exercise.  I think I'm ready to finish PT today.    PAIN:  Are you having pain? Yes: NPRS scale: 1-2/10 Pain location: Lt knee Pain description: sore Aggravating factors: Being up on it Relieving factors: Not messing with it  The Patient-Specific Functional Scale  Initial:  I am going to ask you to identify up to 3 important activities that you  are unable to do or are having difficulty with as a result of this problem.  Today are there any activities that you are unable to do or having difficulty with because of this?  (Patient shown scale and patient rated each activity)  Follow up: When you first came in you had difficulty performing these activities.  Today do you still have difficulty?  Patient-Specific activity scoring scheme (Point to one number):  0 1  2 3 4 5 6 7 8 9 10  Unable                                                                                                          Able to perform To perform                                                                                                    activity at the same Activity         Level as before                                                                                                                       Injury or problem  Activity   Able to flight of stairs                                                                              7/23:      6                   2.        Difficulty with riding lawnmower pushing in the pedal and hold while shifting 7/23:7                                                              3.           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    PATIENT SURVEYS:  FOTO 42, 63 goal 04/01/23: 58 06/07/23: 58% 07/07/23: 75%   COGNITION: Overall cognitive status: Within functional limits for tasks assessed                         SENSATION: Denies numbness/tingling     MUSCLE LENGTH:   Maisie Fus test: Right  deg; Left 90 deg (+) pain Prone ely test 04/01/23: Lt 95 deg, Rt 90   POSTURE: weight shift right   PALPATION: Muscle spasm Lt quad, Lt adductor, medial knee    LOWER  EXTREMITY ROM:   Active ROM Right eval Left eval  Hip flexion      Hip extension      Hip abduction      Hip adduction      Hip internal rotation      Hip external rotation      Knee flexion 140 125  Knee extension 0 -5  Ankle dorsiflexion      Ankle plantarflexion      Ankle inversion      Ankle eversion       (Blank rows = not tested) 02/25/23: Lt knee flexion: 130 deg, extension lacking 5 deg 04/01/23: Lt knee flexion 140 deg, extension 0 deg 06/07/23: 0-135 active  07/07/23 0-135 active     LOWER EXTREMITY MMT:   MMT Right eval Left eval Left 6/11 7/23 8/22  Hip flexion 5 5     Hip extension 5 5     Hip abduction 5 5     Hip adduction         Hip internal rotation         Hip external rotation         Knee flexion      4+ 5-  Knee extension 5 3 (+) pain 04/01/23 5/5 4- painful 4 4+  Ankle dorsiflexion         Ankle plantarflexion         Ankle inversion         Ankle eversion          (Blank rows = not tested)   LOWER EXTREMITY SPECIAL TESTS:      FUNCTIONAL TESTS:  5 times sit to stand: 14 sec, UE pushing into armrests  Timed up and go (TUG): 17 sec, slow turning 7/23: 5x sit to stand: 12.54 no arms TUG: 8.72 no arms   GAIT: Distance walked: Lt antalgic patter, decreased knee extension on Lt  Assistive device utilized:  none Level of assistance: Complete Independence Comments: stairs- prefers step to pattern with Rt LE leading      TODAY'S TREATMENT:  8/22: Knee ROM FOTO Review of progress toward goals Pt given info on [facility] program 9 week Start up progam    7/23: Nu-Step L5 10 min FOTO TUG  5x sit to stand Up and down 4 steps x2 with 1 railing min use reciprocally 2/10 pain rating Patient specific functional scale for goal setting KT patellar tendon 2 strips (good relief reported) patient took pictures/video for home trial Leg press seat 7 80# 10x 35# 20x   05/31/23  Pt seen for aquatic therapy today.  Treatment took place in  water 3.5-4.75 ft in depth at the [facility] pool. Temp of water was 91.  Pt entered/exited the pool via stairs using step-to pattern with hand rail.  Exercises - Side lunge with hand buoys   - Sit to Stand   - Squat   - Water Step Up on Bottom Step   - Standing Hip Hinge  - Flutter Kicking/Windshield Wipers   - Alternating Forward Lunge  - Alternating Backward Lunge   - Seated Straddle on Flotation Forward Breast Stroke Arms and Bicycle Legs   Pt requires the buoyancy and hydrostatic pressure of water for support, and to offload joints by unweighting joint load by at least 50 % in navel deep water and by at least 75-80% in chest to neck deep water.  Viscosity of the water is needed for resistance of strengthening. Water current perturbations provides challenge to standing balance requiring increased core activation.  After dried off:  applied reg Rock tape to Lt knee in 2 I-strips, med/ lateral joint crossing at tibial tuberosity/patellar tendon with 25% stretch - to decompress tissue and increase proprioception.    After dried off:  applied reg Rock tape to Lt knee in 2 I-strips, med/ lateral joint crossing at tibial tuberosity/patellar tendon with 25% stretch - to decompress tissue and increase proprioception.    6/11: Nu-Step L1 5 min Forward 4 inch step ups 2x5 Lateral step ups painful inferior knee 5x  2 inch step downs 2 sets of 5 Single leg calf raise 2 sets of 5 KT patellar tendon 2 strips (good relief reported) Leg press seat 7 70# 10x 35# 20x HS red power cord 2x10 Hip machine 40#: abduction, extension and flexion 10x each   04/01/23 Manual:  Lt TKA mobilization STM Lt quad/adductors Prone quad stretch Rt 90 deg, Lt 95 deg  Attempting floor to stand from tall kneeling position, discussing hand placement and benefits of airex pad for knee cushion. HEP discussed and encouraged pt to perform stretches following epsom soak     5/10: Nu-Step L1 5  min Discussion of length of time for recovery Discussed option of aquatic PT Seated blue band press out 10x 2 Seated blue band HS curls anchored around other leg 10x2 Seated blue band clams 20x 4 inch forward step ups with left 2x5 (discussed low volume of these and not on full height step)      PATIENT EDUCATION:  Education details: updates to goals/POC; HEP  Person educated: Patient Education method: Explanation and Handouts Education comprehension: verbalized understanding   HOME EXERCISE PROGRAM Access Code: [email or web address] URL: [email or web address] Date: 04/01/2023 Prepared by: [facility] - Out[facility] - [facility]  Exercises - Supine Quadriceps Stretch with Strap on Table  - 2 x daily - 7 x weekly - 10 reps - 10 seconds hold - Side Lunge Adductor Stretch  - 2 x daily - 7 x weekly - 2 sets - 30 seconds hold - Standing Terminal Knee Extension with Resistance  - 1 x daily - 7 x weekly - 2 sets - 10 reps - Seated Knee Extension Stretch with Chair  - 2 x daily - 7 x weekly - 2-3 minutes hold - Seated Leg Press with Resistance  - 1 x daily - 7 x weekly - 1-2 sets - 10 reps - Seated Hamstring Curls with Resistance  - 1 x daily - 7 x weekly - 1-2 sets - 10 reps - Seated Hip Abduction with Resistance  - 1 x daily - 7 x  weekly - 1-2 sets - 10 reps - Step Up  - 1 x daily - 7 x weekly - 1-2 sets - 5 reps   AQUATIC This aquatic home exercise program from [employer] utilizes pictures from land based exercises, but has been adapted prior to lamination and issuance.   Access Code: [email or web address] URL: [email or web address] Date: 05/31/2023 Prepared by: Geni Bers  Exercises - Side lunge with hand buoys  - 1 reps - Sit to Stand  - 1 x daily - 1-3 x weekly - 1-3 sets - 10 reps - Squat  - 1 x daily - 1-3 x weekly - 1-3 sets - 10 reps - Water Step Up on Bottom Step  - 1 x daily - 1-3 x weekly - 1-3 sets - 10 reps - Standing Hip Hinge  - 1 x  daily - 1-3 x weekly - 1-3 sets - 10 reps - Flutter Kicking/Windshield Wipers  - 1 x daily - 1-3 x weekly - 2-3 sets - 10-20 reps - Alternating Forward Lunge  - 1 x daily - 1-3 x weekly - 1-3 sets - 10 reps - Alternating Backward Lunge  - 1 x daily - 1-3 x weekly - 1-3 sets - 10 reps - Seated Straddle on Flotation Forward Breast Stroke Arms and Bicycle Legs    ASSESSMENT:   CLINICAL IMPRESSION:    The patient has met the majority of rehab goals, with noted improvements in pain reduction, outcome score, ROM, strength and functional mobility.  A comprehensive HEP has been established and anticipate further improvements over time with regular performance of the program.  Recommend discharge from PT at this time.   OBJECTIVE IMPAIRMENTS: Abnormal gait, decreased activity tolerance, decreased endurance, decreased mobility, difficulty walking, decreased ROM, decreased strength, hypomobility, increased muscle spasms, improper body mechanics, and pain.    ACTIVITY LIMITATIONS: sitting, standing, squatting, stairs, transfers, locomotion level, and caring for others   PARTICIPATION LIMITATIONS: community activity, occupation, and yard work   PERSONAL FACTORS: Age and Fitness are also affecting patient's functional outcome.    REHAB POTENTIAL: Good   CLINICAL DECISION MAKING: Stable/uncomplicated   EVALUATION COMPLEXITY: Low     GOALS: Goals reviewed with patient? Yes   SHORT TERM GOALS: Target date: 02/08/23 Pt will be independent with initial HEP to improve LE ROM and mobility.  Baseline: Goal status: MET       LONG TERM GOALS: Target date: 08/02/23   Pt will be able to complete TUG in less than 14 seconds. Baseline:  Goal status: met 7/23   2.  Pt will have improved pain and function, evident by her ability to complete up to a 4 hour shift at work. Baseline: 2 hours  Goal status: met 7/23   3.  Pt will report atleast 50% improvement in her knee pain from the start of  PT. Baseline:  Goal status:MET   4.  Pt will have improved Lt quad strength to 5/5 MMT. Baseline: MET Goal status: partially met   5.  Pt will be able to ascend and descend 4 steps with or with step over step pattern 2/3 trials without pain.  Baseline: Pt will be able to ascend and descend 4 steps with or without handrail and step over step pattern Goal status:  met 7/23  6. Able to ascend/descend flight of steps multiple times (boyfriend's house) with PSFS rating of 8/10 Partially met 8/22 7. Improved LE strength need to press the brake pedal on mower with PSFS rating of 8/10 Met  8/22 9/10 8. Patient able to walk an  hour at work with PSFS rating of 8/10 Met 8/22 2 hours     PHYSICAL THERAPY DISCHARGE SUMMARY  Visits from Start of Care: 15  Current functional level related to goals / functional outcomes: See clinical impressions above   Remaining deficits: As above   Education / Equipment: HEP   Patient agrees to discharge. Patient goals were met. Patient is being discharged due to meeting the stated rehab goals.  Lavinia Sharps, PT 07/07/23 10:57 PM Phone: [phone] Fax: [phone]

## 2023-07-14 ENCOUNTER — Encounter: Payer: Medicare Other | Admitting: Physical Therapy

## 2023-07-19 ENCOUNTER — Encounter: Payer: Medicare Other | Admitting: Physical Therapy

## 2023-07-26 ENCOUNTER — Encounter: Payer: Medicare Other | Admitting: Physical Therapy

## 2023-08-02 ENCOUNTER — Encounter: Payer: Medicare Other | Admitting: Physical Therapy

## 2023-08-09 ENCOUNTER — Encounter: Payer: Medicare Other | Admitting: Physical Therapy

## 2023-11-23 ENCOUNTER — Encounter (HOSPITAL_BASED_OUTPATIENT_CLINIC_OR_DEPARTMENT_OTHER): Payer: Self-pay | Admitting: Emergency Medicine

## 2023-11-23 ENCOUNTER — Emergency Department (HOSPITAL_BASED_OUTPATIENT_CLINIC_OR_DEPARTMENT_OTHER): Payer: Medicare Other

## 2023-11-23 ENCOUNTER — Other Ambulatory Visit: Payer: Self-pay

## 2023-11-23 ENCOUNTER — Emergency Department (HOSPITAL_BASED_OUTPATIENT_CLINIC_OR_DEPARTMENT_OTHER)
Admission: EM | Admit: 2023-11-23 | Discharge: 2023-11-23 | Disposition: A | Discharge status: Home / Self Care | Payer: Medicare Other | Attending: Emergency Medicine | Admitting: Emergency Medicine

## 2023-11-23 DIAGNOSIS — Z7982 Long term (current) use of aspirin: Secondary | ICD-10-CM | POA: Insufficient documentation

## 2023-11-23 DIAGNOSIS — S62653A Nondisplaced fracture of medial phalanx of left middle finger, initial encounter for closed fracture: Secondary | ICD-10-CM | POA: Insufficient documentation

## 2023-11-23 DIAGNOSIS — W1830XA Fall on same level, unspecified, initial encounter: Secondary | ICD-10-CM | POA: Insufficient documentation

## 2023-11-23 DIAGNOSIS — W19XXXA Unspecified fall, initial encounter: Secondary | ICD-10-CM

## 2023-11-23 DIAGNOSIS — Z8673 Personal history of transient ischemic attack (TIA), and cerebral infarction without residual deficits: Secondary | ICD-10-CM | POA: Insufficient documentation

## 2023-11-23 DIAGNOSIS — E119 Type 2 diabetes mellitus without complications: Secondary | ICD-10-CM | POA: Insufficient documentation

## 2023-11-23 DIAGNOSIS — S60032A Contusion of left middle finger without damage to nail, initial encounter: Secondary | ICD-10-CM | POA: Diagnosis present

## 2023-11-23 NOTE — ED Notes (Signed)
 Discharge paperwork reviewed entirely with patient, including follow up care. Pain was under control. No prescriptions were called in, but all questions were addressed.  Pt verbalized understanding as well as all parties involved. No questions or concerns voiced at the time of discharge. No acute distress noted.   Pt ambulated out to PVA without incident or assistance.  Pt advised they will notify their PCP immediately. and Pt advised they will seek followup care with a specialist and followup with their PCP.

## 2023-11-23 NOTE — ED Triage Notes (Signed)
 Was pulled by her dog , fell forward , left shoulder injury , left middle finger injury . Obvious bruising to finger , edema . Limited ROM left arm,

## 2023-11-23 NOTE — Discharge Instructions (Signed)
 Please follow-up with your primary care provider and hand specialist have attached you for you today.  Today your x-ray shows you have a fracture in your middle finger and you are placed in a splint.  You please use Tylenol  every 6 as needed for pain.  If you begin to have decreased sensation, skin color changes, rigid compartments or other worsening or changing of symptoms please return to the ER.

## 2023-11-23 NOTE — ED Provider Notes (Addendum)
 [facility] Provider Note   CSN: [id number] Arrival date & time: 11/23/23  1555     History  Chief Complaint  Patient presents with   Madison Tate    Madison Tate is a 73 y.o. 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  Patient was trying to take her dog out to pee before going to the vet when the dog bolted out from underneath her and she fell with her left arm outstretched.  Patient states that her left shoulder hurts and she cannot raise her left arm above her shoulder as she gets sharp pains in her bicep but is able to move her elbow.  Patient also notes that her left middle finger appears bruised and she think she may have broken it however states she can still feel her finger.  Patient has injury to the head or neck area and is not any blood thinners.  Home Medications Prior to Admission medications   Medication Sig Start Date End Date Taking? Authorizing Provider  amLODipine (NORVASC) 5 MG tablet Take 5 mg by mouth daily.    [provider]  aspirin 325 MG tablet Take 325 mg by mouth daily.    [provider]  conjugated estrogens (PREMARIN) vaginal cream Place vaginally daily.    [provider]  doxycycline  (VIBRAMYCIN ) 100 MG capsule Take 1 capsule (100 mg total) by mouth 2 (two) times daily. One po bid x 7 days 06/03/19   Madison Hollering, MD  Iron-Vitamins (GERITOL COMPLETE PO) Take by mouth.    [provider]  propantheline (PROBANTHINE) 15 MG tablet Take 15 mg by mouth 4 (four) times daily.    [provider]  propranolol (INDERAL) 20 MG tablet Take 20 mg by mouth 3 (three) times daily.    [provider]  rosuvastatin (CRESTOR) 10 MG tablet Take 10 mg by mouth daily.    [provider]      Allergies    Bactrim [sulfamethoxazole-trimethoprim], Sulfa antibiotics, Topiramate, and Toprol xl [metoprolol tartrate]     Review of Systems   Review of Systems  Physical Exam Updated Vital Signs BP (!) 144/86 (BP Location: Right Arm)   Pulse 90   Temp 98.2 F (36.8 C) (Oral)   Resp 18   Wt 83.9 kg   SpO2 99%   BMI 29.86 kg/m  Physical Exam Vitals reviewed.  Constitutional:      General: She is not in acute distress. HENT:     Head: Normocephalic and atraumatic.  Eyes:     Extraocular Movements: Extraocular movements intact.     Conjunctiva/sclera: Conjunctivae normal.     Pupils: Pupils are equal, round, and reactive to light.  Cardiovascular:     Rate and Rhythm: Normal rate and regular rhythm.     Pulses: Normal pulses.     Heart sounds: Normal heart sounds.     Comments: 2+ bilateral radial/dorsalis pedis pulses with regular rate Pulmonary:     Effort: Pulmonary effort is normal. No respiratory distress.     Breath sounds: Normal breath sounds.  Abdominal:     Palpations: Abdomen is soft.     Tenderness: There is no abdominal tenderness. There is no guarding or rebound.  Musculoskeletal:     Cervical back: Normal range of motion and neck supple.     Comments: 5 out of 5 bilateral grip/leg extension strength Tenderness to left middle finger without crepitus  or deformities noted or signs of open fracture Unable to range arm above shoulder with tenderness anterior portion of the shoulder No clavicular abnormalities noted Soft compartments Pain not out of proportion  Skin:    General: Skin is warm and dry.     Capillary Refill: Capillary refill takes less than 2 seconds.  Neurological:     General: No focal deficit present.     Mental Status: She is alert and oriented to person, place, and time.     Comments: Sensation intact in all 4 limbs  Psychiatric:        Mood and Affect: Mood normal.     ED Results / Procedures / Treatments   Labs (all labs ordered are listed, but only abnormal results are displayed) Labs Reviewed - No data to display  EKG None  Radiology DG Hand  Complete Left Result Date: 11/23/2023 CLINICAL DATA:  Injury. Fall by dog and fell forward landing on left humerus and left hand. Left third and fifth finger bruising and swelling. EXAM: LEFT HAND - COMPLETE 3+ VIEW COMPARISON:  Left thumb radiographs 06/15/2009 FINDINGS: There is diffuse decreased bone mineralization. There is oblique linear lucency within the middle phalanx of the third finger indicating an acute nondisplaced fracture of the mid to distal shaft. Degenerative changes including joint space narrowing, subchondral sclerosis, subchondral cystic change, and peripheral osteophytosis are severe at the thumb carpometacarpal joint; mild-to-moderate at the thumb interphalangeal joint; moderate at the second, third, and fifth, mild-to-moderate at the fourth DIP joints; mild-to-moderate at the second and fifth PIP joints; and mild at the third and fourth PIP joints. A likely degenerative cyst is seen at the lateral aspect of the third metacarpal head. No dislocation. IMPRESSION: 1. Acute nondisplaced fracture of the mid to distal shaft of the middle phalanx of the third finger. 2. Osteoarthritis as above. Electronically Signed   By: Madison Tate M.D.   On: 11/23/2023 18:45   DG Humerus Left Result Date: 11/23/2023 CLINICAL DATA:  Recent fall with left arm pain, initial encounter EXAM: LEFT HUMERUS - 2+ VIEW COMPARISON:  None Available. FINDINGS: Degenerative changes of the acromioclavicular joint are seen. No acute fracture or dislocation is noted. Mild olecranon spurring is noted. IMPRESSION: No acute abnormality noted. Electronically Signed   By: Madison Tate M.D.   On: 11/23/2023 18:39    Procedures Procedures    Medications Ordered in ED Medications - No data to display  ED Course/ Medical Decision Making/ A&P                                 Medical Decision Making Amount and/or Complexity of Data Reviewed Radiology: ordered.   Madison Tate 73 y.o. presented today for fall. Working  DDx that I considered at this time includes, but not limited to, vasovagal episode, mechanical fall, ICH, epidural/subdural hematoma, basilar skull fracture, anemia, electrolyte abnormalities, drug-induced, arrhythmia, UTI, fracture, contusion, soft tissue injury.  R/o DDx: vasovagal episode, ICH, epidural/subdural hematoma, basilar skull fracture, anemia, electrolyte abnormalities, drug-induced, arrhythmia, UTI, soft tissue injury: These are considered less likely due to history of present illness, physical exam, lab/imaging findings  Review of prior external notes: 06/15/2023 office visit  Unique Tests and My Interpretation:  Left humerus x-ray: No acute findings Left hand x-ray: Nondisplaced fracture in the middle portion of patient's middle finger Left shoulder x-ray: No acute findings  Social Determinants of Health: none  Discussion with  Independent Historian: None  Discussion of Management of Tests: None  Risk: Low: based on diagnostic testing/clinical impression and treatment plan  Risk Stratification Score: None  Plan: On exam patient was in no acute distress with stable vitals.  Patient mechanical fall and does not wish to have further workup as she is only concerned about her left arm and left middle finger and so we will obtain imaging of these.  With patient denying any head or neck injury or neurologic symptoms do feel this is reasonable.  Upon my independent interpretation of the x-ray of patient's left middle finger does.  Take a small fraction so we will order the splint and order a sling for patient's shoulder and have her follow-up with a hand specialist.  Patient was offered Tylenol  but declined at this time.  Pending radiologist read will discharge.  X-rays do show nondisplaced fracture in patient's left middle finger.  Patient's finger was placed in a splint and will be given hand follow-up.  Encouraged her to use Tylenol  or 6 hours needed for pain along with ice and to  keep her hand in a splint.  Discussed return precautions with her.  Triage also ordered an x-ray of the humerus however patient states she was having shoulder pain and would like to have a shoulder x-ray.  Patient had shared decision-making patient would like to be discharged after the shoulder x-ray after this provider is taking a look at it and to be notified via phone call if there are any abnormalities.  Patient is already been placed in a sling.  Will call the patient if there are any abnormalities.  I did not see any abnormalities on the left shoulder x-ray and so will discharge and call the patient if there are any concerning features otherwise patient is instructed to follow-up with orthopedics and to take Tylenol  every 6 as needed pain.  Patient was given return precautions. Patient stable for discharge at this time.  Patient verbalized understanding of plan.  Radiology report stated that there could be a fracture however difficult to assess and suggest possible CT scan.  I called the patient about these findings and after confirming her identity we had shared decision making patient states that since she was already in a sling and following up with orthopedics she would rather just follow-up with them then come back to get the CT scan which is reasonable.  This chart was dictated using voice recognition software.  Despite best efforts to proofread,  errors can occur which can change the documentation meaning.         Final Clinical Impression(s) / ED Diagnoses Final diagnoses:  Fall, initial encounter  Closed nondisplaced fracture of middle phalanx of left middle finger, initial encounter    Rx / DC Orders ED Discharge Orders     None         Victor Lynwood ONEIDA DEVONNA 11/23/23 1939    Victor Lynwood ONEIDA, PA-C 11/23/23 2052    Freddi Hamilton, MD 11/23/23 2320

## 2023-11-24 ENCOUNTER — Other Ambulatory Visit: Payer: Self-pay | Admitting: Surgery

## 2023-11-24 DIAGNOSIS — R1031 Right lower quadrant pain: Secondary | ICD-10-CM

## 2023-11-30 ENCOUNTER — Ambulatory Visit
Admission: RE | Admit: 2023-11-30 | Discharge: 2023-11-30 | Disposition: A | Discharge status: Home / Self Care | Payer: Medicare Other | Source: Ambulatory Visit | Attending: Surgery | Admitting: Surgery

## 2023-11-30 DIAGNOSIS — R1031 Right lower quadrant pain: Secondary | ICD-10-CM

## 2023-11-30 MED ORDER — IOPAMIDOL (ISOVUE-300) INJECTION 61%
100.0000 mL | Freq: Once | INTRAVENOUS | Status: AC | PRN
Start: 2023-11-30 — End: 2023-11-30
  Administered 2023-11-30: 100 mL via INTRAVENOUS

## 2023-12-09 ENCOUNTER — Telehealth: Payer: Self-pay | Admitting: Surgery

## 2023-12-09 NOTE — Telephone Encounter (Signed)
Reviewed patient's CT scan with her via phone.  She reports she has had no further abdominal pain since stopping her Ozempic.  We discussed options including continued abstinence from Ozempic, bowel regimen, and proceeding with cholecystectomy.  Given her symptoms currently as well as significant grief issues that she is going through right now, she would like to continue observation.  I will see her back in about 6 months, sooner if needed.  Our office will reach out to her to make an appointment.

## 2024-01-13 LAB — EXTERNAL GENERIC LAB PROCEDURE: COLOGUARD: NEGATIVE

## 2024-01-13 LAB — COLOGUARD: COLOGUARD: NEGATIVE

## 2024-02-01 ENCOUNTER — Encounter: Payer: Self-pay | Admitting: Physical Therapy

## 2024-02-01 ENCOUNTER — Ambulatory Visit: Attending: Orthopedic Surgery | Admitting: Physical Therapy

## 2024-02-01 ENCOUNTER — Other Ambulatory Visit: Payer: Self-pay

## 2024-02-01 DIAGNOSIS — M25512 Pain in left shoulder: Secondary | ICD-10-CM | POA: Diagnosis not present

## 2024-02-01 DIAGNOSIS — M6281 Muscle weakness (generalized): Secondary | ICD-10-CM | POA: Insufficient documentation

## 2024-02-01 DIAGNOSIS — S46912A Strain of unspecified muscle, fascia and tendon at shoulder and upper arm level, left arm, initial encounter: Secondary | ICD-10-CM | POA: Diagnosis not present

## 2024-02-01 DIAGNOSIS — X58XXXA Exposure to other specified factors, initial encounter: Secondary | ICD-10-CM | POA: Diagnosis not present

## 2024-02-01 DIAGNOSIS — M25612 Stiffness of left shoulder, not elsewhere classified: Secondary | ICD-10-CM | POA: Diagnosis present

## 2024-02-01 NOTE — Therapy (Signed)
 OUTPATIENT PHYSICAL THERAPY SHOULDER EVALUATION   Patient Name: Madison Tate MRN: [id number] DOB:04-10-1951, 73 y.o., female Today's Date: 02/01/2024  END OF SESSION:  PT End of Session - 02/01/24 1013     Visit Number 1    Date for PT Re-Evaluation 03/28/24    Authorization Type Medicare/Tricare    PT Start Time 1015    PT Stop Time 1055    PT Time Calculation (min) 40 min    Activity Tolerance Patient tolerated treatment well             Past Medical History:  Diagnosis Date   Anxiety    Diabetes mellitus without complication (HCC)    Hypercholesteremia    Hypertension    TIA (transient ischemic attack)    Past Surgical History:  Procedure Laterality Date   ABDOMINAL HYSTERECTOMY     There are no active problems to display for this patient.   PCP: none  REFERRING PROVIDER: Mack Hook MD  REFERRING DIAG: left shoulder strain  THERAPY DIAG:  Left shoulder pain, left shoulder stiffness, weakness Rationale for Evaluation and Treatment: Rehabilitation  ONSET DATE: Jan 8  SUBJECTIVE:                                                                                                                                                                                      SUBJECTIVE STATEMENT: Fell onto left shoulder when the dog pulled forward onto left shoulder. Left fingers go numb.  Fractured middle finger; Possible little tear of RTC, bone fragments, arthritis.  Wore sling 6 weeks.  Doing table slides (seated)  Restrictions for work: can't go unless 10 hrs/week and no lift 25# ([employer]) Hand dominance: Right  PERTINENT HISTORY: Partner of 16 years, Rosanne Ashing, passed away 2025/12/19Neck pain/vertigo DM; HTN; hx of TIA  PAIN:  Are you having pain? Yes NPRS scale: 5/10 Pain location: upper arm Pain orientation: Left  PAIN TYPE: aching Pain description: intermittent  Aggravating factors: overhead elevation; lying on it left side, putting on a jacket Relieving  factors: heat, Motrin as needed   PRECAUTIONS: work can't go unless 10 hrs/week and no lift 25# ([employer])   WEIGHT BEARING RESTRICTIONS: No  FALLS:  Has patient fallen in last 6 months? Yes. Number of falls 1  LIVING ENVIRONMENT: Lives in: House/apartment   OCCUPATION: [employer] has to lift 50# (out of work until 6/6)  PLOF: Independent  PATIENT GOALS:decrease pain and strength back  NEXT MD VISIT: 4/22 Dr. Janee Morn   OBJECTIVE:  Note: Objective measures were completed at Evaluation unless otherwise noted.  DIAGNOSTIC FINDINGS:  11/23/23: CLINICAL DATA:  Walking her puppy any pulled her  down, landing on left arm. Left arm in a sling. EXAM: LEFT SHOULDER COMPARISON:  None Available.   FINDINGS: Calcific densities or osseous fragments project inferior to the coracoid process on AP view and medial to the scapula on Y-view. These are indeterminate and may be chronic however acute fracture is difficult to exclude. Consider CT for further evaluation. 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   IMPRESSION: Calcific densities or osseous fragments project inferior to the coracoid process on AP view and medial to the scapula on Y-view. These are indeterminate and may be chronic however acute fracture is difficult to exclude. Consider CT for further evaluation. 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 CLINICAL DATA:  Recent fall with left arm pain, initial encounter   EXAM: LEFT HUMERUS - 2+ VIEW   COMPARISON:  None Available.   FINDINGS: Degenerative changes of the acromioclavicular joint are seen. No acute fracture or dislocation is noted. Mild olecranon spurring is noted.   IMPRESSION: No acute abnormality noted.  PATIENT SURVEYS:  Quick Dash 63.6%  COGNITION: Overall cognitive status: Within functional limits for tasks assessed     UPPER EXTREMITY ROM:   Active ROM Right eval Left eval  Shoulder flexion 160 140  Shoulder extension    Shoulder abduction  162 77 painful   Shoulder adduction    Shoulder internal rotation T10 L1  Shoulder external rotation 72 50 painful  Elbow flexion    Elbow extension    Wrist flexion    Wrist extension    Wrist ulnar deviation    Wrist radial deviation    Wrist pronation    Wrist supination    (Blank rows = not tested)  UPPER EXTREMITY MMT:  MMT Right eval Left eval  Shoulder flexion 5 3  Shoulder extension 5 4  Shoulder abduction 5 2+  Shoulder adduction    Shoulder internal rotation 5 3  Shoulder external rotation 5 3  Middle trapezius    Lower trapezius    Elbow flexion 5 4  Elbow extension 5 4  Wrist flexion    Wrist extension    Wrist ulnar deviation    Wrist radial deviation    Wrist pronation    Wrist supination    Grip strength (lbs)    (Blank rows = not tested)                                                                                                                            TREATMENT DATE: 02/01/24 Evaluation Initial HEP   PATIENT EDUCATION: Education details: Educated patient on anatomy and physiology of current symptoms, prognosis, plan of care as well as initial self care strategies to promote recovery Person educated: Patient Education method: Explanation Education comprehension: verbalized understanding  HOME EXERCISE PROGRAM: Access Code: [email or web address] URL: [email or web address] Date: 02/01/2024 Prepared by: Lavinia Sharps  Exercises - Supine Shoulder Flexion Extension AAROM with Dowel  - 2 x daily - 7 x weekly - 1 sets - 10  reps - Supine Shoulder External Rotation AAROM with Dowel  - 2 x daily - 7 x weekly - 1 sets - 10 reps - Seated Single Arm Elbow Flexion with Dumbbell  - 2 x daily - 7 x weekly - 1 sets - 10 reps - Seated Scapular Retraction  - 2 x daily - 7 x weekly - 1 sets - 10 reps - Circular Shoulder Pendulum with Table Support  - 2 x daily - 7 x weekly - 1 sets - 10 reps  ASSESSMENT:  CLINICAL IMPRESSION: Patient is a 73 y.o.  female who was seen today for physical therapy evaluation and treatment for a left shoulder strain. The patient would benefit from skilled PT to address shoulder range of motion limitations particularly shoulder elevation (flexion and especially abduction) as well as limited external and internal rotation.  The patient would also benefit from strengthening to correct asymmetries including weakness in rotator cuff and periscapular musculature and decreasing overuse of the upper trapezius.  These impairments  and pain contribute to difficulties with ADLS like reaching overhead, lifting objects to higher shelves, reaching behind the back and with carrying heavier objects.  She is currently out of work (job requirements include lifting 50#).   OBJECTIVE IMPAIRMENTS: decreased activity tolerance, decreased ROM, decreased strength, impaired perceived functional ability, impaired UE functional use, and pain.   ACTIVITY LIMITATIONS: carrying, lifting, sleeping, reach over head, and hygiene/grooming  PARTICIPATION LIMITATIONS: meal prep, cleaning, laundry, and occupation  PERSONAL FACTORS: Past/current experiences, 1 comorbidity: DN, history of neck pain/vertigo, and grief over the recent loss of her long time partner  are also affecting patient's functional outcome.   REHAB POTENTIAL: Good  CLINICAL DECISION MAKING: Stable/uncomplicated  EVALUATION COMPLEXITY: Low   GOALS: Goals reviewed with patient? Yes  SHORT TERM GOALS: Target date: 02/29/2024  The patient will demonstrate knowledge of basic self care strategies and exercises to promote healing  Baseline: Goal status: INITIAL  2.  The patient will have improved shoulder elevation ROM to at least 150 degrees needed for grooming/dressing purposes as well as reaching high shelves  Baseline:  Goal status: INITIAL  3.  The patient will have grossly 4-/5 strength needed to lift and lower a 2# object from a high shelf  Baseline:  Goal status:  INITIAL  4.  Patient will be able to carry a light to medium shopping bag in left hand Baseline:  Goal status: INITIAL     LONG TERM GOALS: Target date: 03/28/2024   The patient will be independent in a safe self progression of a home exercise program to promote further recovery of function  Baseline:  Goal status: INITIAL  2.  The patient will report a 75% improvement in pain levels with functional activities which are currently difficult including cooking, carrying things, grooming/dressing Baseline:  Goal status: INITIAL  3.  The patient will have improved shoulder elevation ROM to at least 155 degrees needed for grooming/dressing purposes as well as reaching high shelves  Baseline:  Goal status: INITIAL  4.  The patient will have grossly 4+/5 strength needed to lift and lower a 5# object from a high shelf  Baseline:  Goal status: INITIAL  5.  Quick DASH outcome score improved to   50%  indicating improved function with less pain Baseline:  Goal status: INITIAL   PLAN:  PT FREQUENCY: 2x/week  PT DURATION: 8 weeks  PLANNED INTERVENTIONS: 97164- PT Re-evaluation, 97110-Therapeutic exercises, 97530- Therapeutic activity, [id number]- Neuromuscular re-education, 97535- Self Care, 29562- Manual  therapy, [id number]- Aquatic Therapy, [phone]- Electrical stimulation (unattended), [phone]- Electrical stimulation (manual), 82956- Ultrasound, [id number]- Ionotophoresis 4mg /ml Dexamethasone, Patient/Family education, Taping, Dry Needling, Joint mobilization, Cryotherapy, and Moist heat  PLAN FOR NEXT SESSION: review supine dowel ex's, UE Ranger on steps/wall mount, pulleys; (healing left middle finger fracture);ball rolls floor or wall  Lavinia Sharps, PT 02/01/24 8:21 PM Phone: [phone] Fax: [phone]

## 2024-02-08 ENCOUNTER — Ambulatory Visit: Admitting: Physical Therapy

## 2024-02-08 DIAGNOSIS — M6281 Muscle weakness (generalized): Secondary | ICD-10-CM

## 2024-02-08 DIAGNOSIS — M25612 Stiffness of left shoulder, not elsewhere classified: Secondary | ICD-10-CM

## 2024-02-08 DIAGNOSIS — S46912A Strain of unspecified muscle, fascia and tendon at shoulder and upper arm level, left arm, initial encounter: Secondary | ICD-10-CM | POA: Diagnosis not present

## 2024-02-08 DIAGNOSIS — M25512 Pain in left shoulder: Secondary | ICD-10-CM

## 2024-02-08 NOTE — Therapy (Signed)
 OUTPATIENT PHYSICAL THERAPY SHOULDER PROGRESS NOTE   Patient Name: Madison Tate MRN: [id number] DOB:[birth year], 73 y.o., female Today's Date: 02/08/2024  END OF SESSION:  PT End of Session - 02/08/24 0931     Visit Number 2    Date for PT Re-Evaluation 03/28/24    Authorization Type Medicare/Tricare    Progress Note Due on Visit 10    PT Start Time 0930    PT Stop Time 1014    PT Time Calculation (min) 44 min    Activity Tolerance Patient tolerated treatment well             Past Medical History:  Diagnosis Date   Anxiety    Diabetes mellitus without complication (HCC)    Hypercholesteremia    Hypertension    TIA (transient ischemic attack)    Past Surgical History:  Procedure Laterality Date   ABDOMINAL HYSTERECTOMY     There are no active problems to display for this patient.   PCP: none  REFERRING PROVIDER: Mack Hook MD  REFERRING DIAG: left shoulder strain  THERAPY DIAG:  Left shoulder pain, left shoulder stiffness, weakness Rationale for Evaluation and Treatment: Rehabilitation  ONSET DATE: Jan 8  SUBJECTIVE:                                                                                                                                                                                      SUBJECTIVE STATEMENT: Right shoulder hurts too maybe from using it more.  Supine clasped hands ROM pretty painful.  Pendulums are good.    Restrictions for work: can't go unless 10 hrs/week and no lift 25# ([employer]) Hand dominance: Right  PERTINENT HISTORY: Partner of 16 years, Rosanne Ashing, passed away 2025-12-22Neck pain/vertigo DM; HTN; hx of TIA Tremors gallstones  PAIN:  Are you having pain? Yes NPRS scale: 6.5/10 Pain location: upper arm Pain orientation: Left  PAIN TYPE: aching Pain description: intermittent  Aggravating factors: overhead elevation; lying on it left side, putting on a jacket Relieving factors: heat, Motrin as needed   PRECAUTIONS: work  can't go unless 10 hrs/week and no lift 25# ([employer])   WEIGHT BEARING RESTRICTIONS: No  FALLS:  Has patient fallen in last 6 months? Yes. Number of falls 1  LIVING ENVIRONMENT: Lives in: House/apartment   OCCUPATION: [employer] has to lift 50# (out of work until 6/6)  PLOF: Independent  PATIENT GOALS:decrease pain and strength back  NEXT MD VISIT: 4/22 Dr. Janee Morn   OBJECTIVE:  Note: Objective measures were completed at Evaluation unless otherwise noted.  DIAGNOSTIC FINDINGS:  11/23/23: CLINICAL DATA:  Walking her puppy any pulled her down, landing on left  arm. Left arm in a sling. EXAM: LEFT SHOULDER COMPARISON:  None Available.   FINDINGS: Calcific densities or osseous fragments project inferior to the coracoid process on AP view and medial to the scapula on Y-view. These are indeterminate and may be chronic however acute fracture is difficult to exclude. Consider CT for further evaluation. 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   IMPRESSION: Calcific densities or osseous fragments project inferior to the coracoid process on AP view and medial to the scapula on Y-view. These are indeterminate and may be chronic however acute fracture is difficult to exclude. Consider CT for further evaluation. 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 CLINICAL DATA:  Recent fall with left arm pain, initial encounter   EXAM: LEFT HUMERUS - 2+ VIEW   COMPARISON:  None Available.   FINDINGS: Degenerative changes of the acromioclavicular joint are seen. No acute fracture or dislocation is noted. Mild olecranon spurring is noted.   IMPRESSION: No acute abnormality noted.  PATIENT SURVEYS:  Quick Dash 63.6%  COGNITION: Overall cognitive status: Within functional limits for tasks assessed     UPPER EXTREMITY ROM:   Active ROM Right eval Left eval  Shoulder flexion 160 140  Shoulder extension    Shoulder abduction 162 77 painful   Shoulder adduction    Shoulder  internal rotation T10 L1  Shoulder external rotation 72 50 painful  Elbow flexion    Elbow extension    Wrist flexion    Wrist extension    Wrist ulnar deviation    Wrist radial deviation    Wrist pronation    Wrist supination    (Blank rows = not tested)  UPPER EXTREMITY MMT:  MMT Right eval Left eval  Shoulder flexion 5 3  Shoulder extension 5 4  Shoulder abduction 5 2+  Shoulder adduction    Shoulder internal rotation 5 3  Shoulder external rotation 5 3  Middle trapezius    Lower trapezius    Elbow flexion 5 4  Elbow extension 5 4  Wrist flexion    Wrist extension    Wrist ulnar deviation    Wrist radial deviation    Wrist pronation    Wrist supination    Grip strength (lbs)    (Blank rows = not tested)  TREATMENT DATE: 02/08/24 Seated UE Ranger 30x Yellow band triceps 20x (open palm secondary to finger fracture) Railing slides towel 15x Seated pulleys flexion and scaption 2 min each Biceps with 1# wrist cuff weight 10x Seated thoracic extension with ball 10x Seated shrugs 10x Seated cervical sidebend 5x Seated cervical rotation 5x Pronation/supination combo with wrist flexion/extension 10x Therapeutic activity for reaching and eventual progression to pushing/pulling and lifting with left UE Pain level 6.5/10                                                                                                                             TREATMENT DATE: 02/01/24 Evaluation Initial HEP   PATIENT EDUCATION:  Education details: Educated patient on anatomy and physiology of current symptoms, prognosis, plan of care as well as initial self care strategies to promote recovery Person educated: Patient Education method: Explanation Education comprehension: verbalized understanding  HOME EXERCISE PROGRAM: Access Code: [email or web address] URL: [email or web address] Date: 02/01/2024 Prepared by: Lavinia Sharps  Exercises - Supine Shoulder Flexion Extension AAROM  with Dowel  - 2 x daily - 7 x weekly - 1 sets - 10 reps - Supine Shoulder External Rotation AAROM with Dowel  - 2 x daily - 7 x weekly - 1 sets - 10 reps - Seated Single Arm Elbow Flexion with Dumbbell  - 2 x daily - 7 x weekly - 1 sets - 10 reps - Seated Scapular Retraction  - 2 x daily - 7 x weekly - 1 sets - 10 reps - Circular Shoulder Pendulum with Table Support  - 2 x daily - 7 x weekly - 1 sets - 10 reps  ASSESSMENT:  CLINICAL IMPRESSION: Moderate pain level on arrival.  Treatment focus on shoulder active -assisted ROM in small range of motion.  Modifications to exercise to avoid finger gripping secondary to healing finger fracture. Therapist monitoring response throughout session and modifying treatment accordingly. Pain level remains 6.5 at the end of session.    OBJECTIVE IMPAIRMENTS: decreased activity tolerance, decreased ROM, decreased strength, impaired perceived functional ability, impaired UE functional use, and pain.   ACTIVITY LIMITATIONS: carrying, lifting, sleeping, reach over head, and hygiene/grooming  PARTICIPATION LIMITATIONS: meal prep, cleaning, laundry, and occupation  PERSONAL FACTORS: Past/current experiences, 1 comorbidity: DN, history of neck pain/vertigo, and grief over the recent loss of her long time partner  are also affecting patient's functional outcome.   REHAB POTENTIAL: Good  CLINICAL DECISION MAKING: Stable/uncomplicated  EVALUATION COMPLEXITY: Low   GOALS: Goals reviewed with patient? Yes  SHORT TERM GOALS: Target date: 02/29/2024  The patient will demonstrate knowledge of basic self care strategies and exercises to promote healing  Baseline: Goal status: INITIAL  2.  The patient will have improved shoulder elevation ROM to at least 150 degrees needed for grooming/dressing purposes as well as reaching high shelves  Baseline:  Goal status: INITIAL  3.  The patient will have grossly 4-/5 strength needed to lift and lower a 2# object from  a high shelf  Baseline:  Goal status: INITIAL  4.  Patient will be able to carry a light to medium shopping bag in left hand Baseline:  Goal status: INITIAL     LONG TERM GOALS: Target date: 03/28/2024   The patient will be independent in a safe self progression of a home exercise program to promote further recovery of function  Baseline:  Goal status: INITIAL  2.  The patient will report a 75% improvement in pain levels with functional activities which are currently difficult including cooking, carrying things, grooming/dressing Baseline:  Goal status: INITIAL  3.  The patient will have improved shoulder elevation ROM to at least 155 degrees needed for grooming/dressing purposes as well as reaching high shelves  Baseline:  Goal status: INITIAL  4.  The patient will have grossly 4+/5 strength needed to lift and lower a 5# object from a high shelf  Baseline:  Goal status: INITIAL  5.  Quick DASH outcome score improved to   50%  indicating improved function with less pain Baseline:  Goal status: INITIAL   PLAN:  PT FREQUENCY: 2x/week  PT DURATION: 8 weeks  PLANNED INTERVENTIONS: 97164- PT Re-evaluation, 97110-Therapeutic exercises, 97530- Therapeutic activity,  91478- Neuromuscular re-education, [phone]- Self Care, 13086- Manual therapy, [id number]- Aquatic Therapy, [phone]- Electrical stimulation (unattended), [phone]- Electrical stimulation (manual), [id number]- Ultrasound, [id number]- Ionotophoresis 4mg /ml Dexamethasone, Patient/Family education, Taping, Dry Needling, Joint mobilization, Cryotherapy, and Moist heat  PLAN FOR NEXT SESSION: gentle active-assisted ROM left shoulder;  UE Ranger on steps; pulleys; (healing left middle finger fracture);ball rolls floor or wall  Lavinia Sharps, PT 02/08/24 8:04 PM Phone: [phone] Fax: [phone]

## 2024-02-10 ENCOUNTER — Encounter: Payer: Self-pay | Admitting: Rehabilitative and Restorative Service Providers"

## 2024-02-10 ENCOUNTER — Ambulatory Visit: Admitting: Rehabilitative and Restorative Service Providers"

## 2024-02-10 DIAGNOSIS — M25612 Stiffness of left shoulder, not elsewhere classified: Secondary | ICD-10-CM

## 2024-02-10 DIAGNOSIS — M25512 Pain in left shoulder: Secondary | ICD-10-CM

## 2024-02-10 DIAGNOSIS — M6281 Muscle weakness (generalized): Secondary | ICD-10-CM

## 2024-02-10 DIAGNOSIS — S46912A Strain of unspecified muscle, fascia and tendon at shoulder and upper arm level, left arm, initial encounter: Secondary | ICD-10-CM | POA: Diagnosis not present

## 2024-02-10 NOTE — Therapy (Signed)
 OUTPATIENT PHYSICAL THERAPY SHOULDER PROGRESS NOTE   Patient Name: Madison Tate MRN: [id number] DOB:1951-04-05, 73 y.o., female Today's Date: 02/10/2024  END OF SESSION:  PT End of Session - 02/10/24 0850     Visit Number 3    Date for PT Re-Evaluation 03/28/24    Authorization Type Medicare/Tricare    Progress Note Due on Visit 10    PT Start Time [phone]    PT Stop Time 0930    PT Time Calculation (min) 43 min    Activity Tolerance Patient limited by pain    Behavior During Therapy [facility] for tasks assessed/performed             Past Medical History:  Diagnosis Date   Anxiety    Diabetes mellitus without complication (HCC)    Hypercholesteremia    Hypertension    TIA (transient ischemic attack)    Past Surgical History:  Procedure Laterality Date   ABDOMINAL HYSTERECTOMY     There are no active problems to display for this patient.   PCP: none  REFERRING PROVIDER: Mack Hook MD  REFERRING DIAG: left shoulder strain  THERAPY DIAG:  Left shoulder pain, left shoulder stiffness, weakness Rationale for Evaluation and Treatment: Rehabilitation  ONSET DATE: Jan 8  SUBJECTIVE:                                                                                                                                                                                      SUBJECTIVE STATEMENT: Patient states that she has been doing her exercises, but they do cause increased pain.   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 Yes NPRS scale: 7-8/10 Pain location: upper arm Pain orientation: Left  PAIN TYPE: aching Pain description: intermittent  Aggravating factors: overhead elevation; lying on it left side, putting on a jacket Relieving factors: heat,  Motrin as needed   PRECAUTIONS: work can't go unless 10 hrs/week and no lift 25# ([employer])   WEIGHT BEARING RESTRICTIONS: No  FALLS:  Has patient fallen in last 6 months? Yes. Number of falls 1  LIVING ENVIRONMENT: Lives in: House/apartment   OCCUPATION: [employer] has to lift 50# (out of work until 6/6)  PLOF: Independent  PATIENT GOALS:decrease pain and strength back  NEXT MD VISIT: 4/22 Dr. Janee Morn   OBJECTIVE:  Note: Objective measures were completed at Evaluation unless otherwise noted.  DIAGNOSTIC FINDINGS:  11/23/23: CLINICAL DATA:  Walking her puppy any pulled  her down, landing on left arm. Left arm in a sling. EXAM: LEFT SHOULDER COMPARISON:  None Available.   FINDINGS: Calcific densities or osseous fragments project inferior to the coracoid process on AP view and medial to the scapula on Y-view. These are indeterminate and may be chronic however acute fracture is difficult to exclude. Consider CT for further evaluation. 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   IMPRESSION: Calcific densities or osseous fragments project inferior to the coracoid process on AP view and medial to the scapula on Y-view. These are indeterminate and may be chronic however acute fracture is difficult to exclude. Consider CT for further evaluation. 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 CLINICAL DATA:  Recent fall with left arm pain, initial encounter   EXAM: LEFT HUMERUS - 2+ VIEW   COMPARISON:  None Available.   FINDINGS: Degenerative changes of the acromioclavicular joint are seen. No acute fracture or dislocation is noted. Mild olecranon spurring is noted.   IMPRESSION: No acute abnormality noted.  PATIENT SURVEYS:  Eval:  Quick Dash 63.6%  COGNITION: Overall cognitive status: Within functional limits for tasks assessed     UPPER EXTREMITY ROM:   Active ROM Right eval Left eval  Shoulder flexion 160 140  Shoulder extension    Shoulder abduction 162 77  painful   Shoulder adduction    Shoulder internal rotation T10 L1  Shoulder external rotation 72 50 painful  Elbow flexion    Elbow extension    Wrist flexion    Wrist extension    Wrist ulnar deviation    Wrist radial deviation    Wrist pronation    Wrist supination    (Blank rows = not tested)  UPPER EXTREMITY MMT:  MMT Right eval Left eval  Shoulder flexion 5 3  Shoulder extension 5 4  Shoulder abduction 5 2+  Shoulder adduction    Shoulder internal rotation 5 3  Shoulder external rotation 5 3  Middle trapezius    Lower trapezius    Elbow flexion 5 4  Elbow extension 5 4  Wrist flexion    Wrist extension    Wrist ulnar deviation    Wrist radial deviation    Wrist pronation    Wrist supination    Grip strength (lbs)    (Blank rows = not tested)  TREATMENT DATE: 02/10/2024 Seated pulleys flexion and scaption 2 min each Seated green 3 way pball rollout x10 each direction Seated shrugs 10x Seated cervical sidebend 10x Seated cervical rotation 10x Seated thoracic extension with ball 10x Seated left biceps curl with 1# dumbbell 2x10 Left shoulder flexion (to tolerance) with 1# dumbbell x10 Pronation/supination combo with wrist flexion/extension 10x Yellow band left triceps 20x (open palm secondary to finger fracture) Standing lef shoulder extension with yellow tband 2x10 Railing slides with towel for flexion and scaption 2x10 each Standing 4 way left shoulder pendulum x20 each direction Cold pack to left shoulder x5 min at end of session    TREATMENT DATE: 02/08/24 Seated UE Ranger 30x Yellow band triceps 20x (open palm secondary to finger fracture) Railing slides towel 15x Seated pulleys flexion and scaption 2 min each Biceps with 1# wrist cuff weight 10x Seated thoracic extension with ball 10x Seated shrugs 10x Seated cervical sidebend 5x Seated cervical rotation 5x Pronation/supination combo with wrist flexion/extension 10x Therapeutic activity for  reaching and eventual progression to pushing/pulling and lifting with left UE Pain level 6.5/10  TREATMENT DATE: 02/01/24 Evaluation Initial HEP   PATIENT EDUCATION: Education details: Educated patient on anatomy and physiology of current symptoms, prognosis, plan of care as well as initial self care strategies to promote recovery Person educated: Patient Education method: Explanation Education comprehension: verbalized understanding  HOME EXERCISE PROGRAM: Access Code: [email or web address] URL: [email or web address] Date: 02/01/2024 Prepared by: Lavinia Sharps  Exercises - Supine Shoulder Flexion Extension AAROM with Dowel  - 2 x daily - 7 x weekly - 1 sets - 10 reps - Supine Shoulder External Rotation AAROM with Dowel  - 2 x daily - 7 x weekly - 1 sets - 10 reps - Seated Single Arm Elbow Flexion with Dumbbell  - 2 x daily - 7 x weekly - 1 sets - 10 reps - Seated Scapular Retraction  - 2 x daily - 7 x weekly - 1 sets - 10 reps - Circular Shoulder Pendulum with Table Support  - 2 x daily - 7 x weekly - 1 sets - 10 reps  ASSESSMENT:  CLINICAL IMPRESSION: Ms Wachter presents to skilled PT reporting that she is having some increased pain this morning.  States that she was rushed this morning and only got to use her heating pad for a couple of minutes.  Patient states that she has not tried a cold pack yet.  Patient able to progress with exercises with minimal cuing for improved technique.  Patient able to progress with functional strengthening during activities to facilitate her ability to perform more activities around her home.  Patient reported that cold pack at end of session did seem to help some with her pain.    OBJECTIVE IMPAIRMENTS: decreased activity tolerance, decreased ROM, decreased strength, impaired perceived functional ability, impaired UE functional  use, and pain.   ACTIVITY LIMITATIONS: carrying, lifting, sleeping, reach over head, and hygiene/grooming  PARTICIPATION LIMITATIONS: meal prep, cleaning, laundry, and occupation  PERSONAL FACTORS: Past/current experiences, 1 comorbidity: DN, history of neck pain/vertigo, and grief over the recent loss of her long time partner  are also affecting patient's functional outcome.   REHAB POTENTIAL: Good  CLINICAL DECISION MAKING: Stable/uncomplicated  EVALUATION COMPLEXITY: Low   GOALS: Goals reviewed with patient? Yes  SHORT TERM GOALS: Target date: 02/29/2024  The patient will demonstrate knowledge of basic self care strategies and exercises to promote healing  Baseline: Goal status: Ongoing  2.  The patient will have improved shoulder elevation ROM to at least 150 degrees needed for grooming/dressing purposes as well as reaching high shelves  Baseline:  Goal status: Ongoing  3.  The patient will have grossly 4-/5 strength needed to lift and lower a 2# object from a high shelf  Baseline:  Goal status: INITIAL  4.  Patient will be able to carry a light to medium shopping bag in left hand Baseline:  Goal status: INITIAL     LONG TERM GOALS: Target date: 03/28/2024   The patient will be independent in a safe self progression of a home exercise program to promote further recovery of function  Baseline:  Goal status: INITIAL  2.  The patient will report a 75% improvement in pain levels with functional activities which are currently difficult including cooking, carrying things, grooming/dressing Baseline:  Goal status: INITIAL  3.  The patient will have improved shoulder elevation ROM to at least 155 degrees needed for grooming/dressing purposes as well as reaching high shelves  Baseline:  Goal status: INITIAL  4.  The patient will have grossly 4+/5 strength needed to lift and  lower a 5# object from a high shelf  Baseline:  Goal status: INITIAL  5.  Quick DASH outcome  score improved to   50%  indicating improved function with less pain Baseline:  Goal status: INITIAL   PLAN:  PT FREQUENCY: 2x/week  PT DURATION: 8 weeks  PLANNED INTERVENTIONS: 97164- PT Re-evaluation, 97110-Therapeutic exercises, 97530- Therapeutic activity, 97112- Neuromuscular re-education, 97535- Self Care, 08657- Manual therapy, [phone]- Aquatic Therapy, G0283- Electrical stimulation (unattended), [phone]- Electrical stimulation (manual), [id number]- Ultrasound, 41324- Ionotophoresis 4mg /ml Dexamethasone, Patient/Family education, Taping, Dry Needling, Joint mobilization, Cryotherapy, and Moist heat  PLAN FOR NEXT SESSION: gentle active-assisted ROM left shoulder;  UE Ranger on steps; pulleys; (healing left middle finger fracture);ball rolls floor or wall    Reather Laurence, PT, DPT 02/10/24, 9:43 AM  [facility] [address] [place], [place] [address] Phone # [phone] Fax [phone]

## 2024-02-14 ENCOUNTER — Ambulatory Visit: Attending: Orthopedic Surgery | Admitting: Physical Therapy

## 2024-02-14 ENCOUNTER — Encounter: Admitting: Rehabilitative and Restorative Service Providers"

## 2024-02-14 DIAGNOSIS — M25612 Stiffness of left shoulder, not elsewhere classified: Secondary | ICD-10-CM

## 2024-02-14 DIAGNOSIS — M25512 Pain in left shoulder: Secondary | ICD-10-CM

## 2024-02-14 DIAGNOSIS — M6281 Muscle weakness (generalized): Secondary | ICD-10-CM | POA: Diagnosis present

## 2024-02-14 NOTE — Therapy (Signed)
 OUTPATIENT PHYSICAL THERAPY SHOULDER PROGRESS NOTE   Patient Name: Madison Tate MRN: [id number] DOB:12/19/1950, 73 y.o., female Today's Date: 02/14/2024  END OF SESSION:  PT End of Session - 02/14/24 1057     Visit Number 4    Date for PT Re-Evaluation 03/28/24    Authorization Type Medicare/Tricare    Progress Note Due on Visit 10    PT Start Time 1058    PT Stop Time 1138    PT Time Calculation (min) 40 min    Activity Tolerance Patient limited by pain             Past Medical History:  Diagnosis Date   Anxiety    Diabetes mellitus without complication (HCC)    Hypercholesteremia    Hypertension    TIA (transient ischemic attack)    Past Surgical History:  Procedure Laterality Date   ABDOMINAL HYSTERECTOMY     There are no active problems to display for this patient.   PCP: none  REFERRING PROVIDER: Mack Hook MD  REFERRING DIAG: left shoulder strain  THERAPY DIAG:  Left shoulder pain, left shoulder stiffness, weakness Rationale for Evaluation and Treatment: Rehabilitation  ONSET DATE: Jan 8  SUBJECTIVE:                                                                                                                                                                                      SUBJECTIVE STATEMENT: I planted 2 rose bushes.  Mentally I had a bad weekend.  It helped the shoulder b/c I didn't do anything.  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 Yes NPRS scale: 4/10 Pain location: upper arm Pain orientation: Left  PAIN TYPE: aching Pain description: intermittent  Aggravating factors: overhead elevation; lying on it left side, putting on a jacket Relieving  factors: heat, Motrin as needed   PRECAUTIONS: work can't go unless 10 hrs/week and no lift 25# ([employer])   WEIGHT BEARING RESTRICTIONS: No  FALLS:  Has patient fallen in last 6 months? Yes. Number of falls 1  LIVING ENVIRONMENT: Lives in: House/apartment   OCCUPATION: [employer] has to lift 50# (out of work until 6/6)  PLOF: Independent  PATIENT GOALS:decrease pain and strength back  NEXT MD VISIT: 4/22 Dr. Janee Morn   OBJECTIVE:  Note: Objective measures were completed at Evaluation unless otherwise noted.  DIAGNOSTIC FINDINGS:  11/23/23: CLINICAL DATA:  Walking her puppy any pulled her down, landing on left arm. Left arm in a sling. EXAM: LEFT SHOULDER COMPARISON:  None Available.   FINDINGS: Calcific densities or osseous fragments project inferior to the coracoid process on AP view and medial to the scapula on Y-view. These are indeterminate and may be chronic however acute fracture is difficult to exclude. Consider CT for further evaluation. 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   IMPRESSION: Calcific densities or osseous fragments project inferior to the coracoid process on AP view and medial to the scapula on Y-view. These are indeterminate and may be chronic however acute fracture is difficult to exclude. Consider CT for further evaluation. 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 CLINICAL DATA:  Recent fall with left arm pain, initial encounter   EXAM: LEFT HUMERUS - 2+ VIEW   COMPARISON:  None Available.   FINDINGS: Degenerative changes of the acromioclavicular joint are seen. No acute fracture or dislocation is noted. Mild olecranon spurring is noted.   IMPRESSION: No acute abnormality noted.  PATIENT SURVEYS:  Eval:  Quick Dash 63.6%  COGNITION: Overall cognitive status: Within functional limits for tasks assessed     UPPER EXTREMITY ROM:   Active ROM Right eval Left eval 4/1 left  Shoulder flexion 160 140 Active assisted 150  degrees  Shoulder extension     Shoulder abduction 162 77 painful    Shoulder adduction     Shoulder internal rotation T10 L1   Shoulder external rotation 72 50 painful   Elbow flexion     Elbow extension     Wrist flexion     Wrist extension     Wrist ulnar deviation     Wrist radial deviation     Wrist pronation     Wrist supination     (Blank rows = not tested)  UPPER EXTREMITY MMT:  MMT Right eval Left eval  Shoulder flexion 5 3  Shoulder extension 5 4  Shoulder abduction 5 2+  Shoulder adduction    Shoulder internal rotation 5 3  Shoulder external rotation 5 3  Middle trapezius    Lower trapezius    Elbow flexion 5 4  Elbow extension 5 4  Wrist flexion    Wrist extension    Wrist ulnar deviation    Wrist radial deviation    Wrist pronation    Wrist supination    Grip strength (lbs)    (Blank rows = not tested)    TREATMENT DATE: 02/14/2024 Moist heat prior to and concurrent with heat: UE Ranger seated 30x Seated Shrugs 8x Scap protraction/retractions 8x Seated bicep curls no resistance 10x Seated pulleys flexion and scaption 2 min each Standing beach ball flexion isometric 10x Standing beach ball external rotation isometric 10x  Seated cervical sidebend 8x Seated cervical rotation 8x Seated table slides flexion 8x, abduction 8x Standing pendulum circles 10x each way Therapeutic activity: reaching in all planes  TREATMENT DATE: 02/10/2024 Seated pulleys flexion and scaption 2 min each Seated green 3 way pball rollout x10 each direction Seated shrugs 10x Seated cervical sidebend 10x Seated cervical rotation 10x Seated thoracic extension with ball 10x Seated left biceps curl with 1# dumbbell 2x10 Left shoulder flexion (to tolerance) with 1# dumbbell x10 Pronation/supination combo with wrist flexion/extension 10x Yellow band left triceps 20x (open palm secondary to finger fracture) Standing lef shoulder extension with yellow tband 2x10 Railing slides  with towel for flexion and scaption 2x10 each Standing 4 way left shoulder pendulum  x20 each direction Cold pack to left shoulder x5 min at end of session    TREATMENT DATE: 02/08/24 Seated UE Ranger 30x Yellow band triceps 20x (open palm secondary to finger fracture) Railing slides towel 15x Seated pulleys flexion and scaption 2 min each Biceps with 1# wrist cuff weight 10x Seated thoracic extension with ball 10x Seated shrugs 10x Seated cervical sidebend 5x Seated cervical rotation 5x Pronation/supination combo with wrist flexion/extension 10x Therapeutic activity for reaching and eventual progression to pushing/pulling and lifting with left UE Pain level 6.5/10                                                                                                                             TREATMENT DATE: 02/01/24 Evaluation Initial HEP   PATIENT EDUCATION: Education details: Educated patient on anatomy and physiology of current symptoms, prognosis, plan of care as well as initial self care strategies to promote recovery Person educated: Patient Education method: Explanation Education comprehension: verbalized understanding  HOME EXERCISE PROGRAM: Access Code: [email or web address] URL: [email or web address] Date: 02/01/2024 Prepared by: Lavinia Sharps  Exercises - Supine Shoulder Flexion Extension AAROM with Dowel  - 2 x daily - 7 x weekly - 1 sets - 10 reps - Supine Shoulder External Rotation AAROM with Dowel  - 2 x daily - 7 x weekly - 1 sets - 10 reps - Seated Single Arm Elbow Flexion with Dumbbell  - 2 x daily - 7 x weekly - 1 sets - 10 reps - Seated Scapular Retraction  - 2 x daily - 7 x weekly - 1 sets - 10 reps - Circular Shoulder Pendulum with Table Support  - 2 x daily - 7 x weekly - 1 sets - 10 reps  ASSESSMENT:  CLINICAL IMPRESSION: Lin's pain level is better today after resting over the weekend but the grief with the loss of her long time partner is weighing  heavily on her per her report. She is attending grief counseling sessions.  She is able to perform low level exercise today with pain remaining around 4/10 (much lower than previous sessions).  Heat concurrently with seated ex's seemed helpful as well.  Therapist monitoring response and modifying treatment accordingly.      OBJECTIVE IMPAIRMENTS: decreased activity tolerance, decreased ROM, decreased strength, impaired perceived functional ability, impaired UE functional use, and pain.   ACTIVITY LIMITATIONS: carrying, lifting, sleeping, reach over head, and hygiene/grooming  PARTICIPATION LIMITATIONS: meal prep, cleaning, laundry, and occupation  PERSONAL FACTORS: Past/current experiences, 1 comorbidity: DN, history of neck pain/vertigo, and grief over the recent loss of her long time partner  are also affecting patient's functional outcome.   REHAB POTENTIAL: Good  CLINICAL DECISION MAKING: Stable/uncomplicated  EVALUATION COMPLEXITY: Low   GOALS: Goals reviewed with patient? Yes  SHORT TERM GOALS: Target date: 02/29/2024  The patient will demonstrate knowledge of basic self care strategies and exercises to promote healing  Baseline: Goal status: Ongoing  2.  The patient will  have improved shoulder elevation ROM to at least 150 degrees needed for grooming/dressing purposes as well as reaching high shelves  Baseline:  Goal status: Ongoing  3.  The patient will have grossly 4-/5 strength needed to lift and lower a 2# object from a high shelf  Baseline:  Goal status: INITIAL  4.  Patient will be able to carry a light to medium shopping bag in left hand Baseline:  Goal status: INITIAL     LONG TERM GOALS: Target date: 03/28/2024   The patient will be independent in a safe self progression of a home exercise program to promote further recovery of function  Baseline:  Goal status: INITIAL  2.  The patient will report a 75% improvement in pain levels with functional  activities which are currently difficult including cooking, carrying things, grooming/dressing Baseline:  Goal status: INITIAL  3.  The patient will have improved shoulder elevation ROM to at least 155 degrees needed for grooming/dressing purposes as well as reaching high shelves  Baseline:  Goal status: INITIAL  4.  The patient will have grossly 4+/5 strength needed to lift and lower a 5# object from a high shelf  Baseline:  Goal status: INITIAL  5.  Quick DASH outcome score improved to   50%  indicating improved function with less pain Baseline:  Goal status: INITIAL   PLAN:  PT FREQUENCY: 2x/week  PT DURATION: 8 weeks  PLANNED INTERVENTIONS: 97164- PT Re-evaluation, 97110-Therapeutic exercises, 97530- Therapeutic activity, 97112- Neuromuscular re-education, 97535- Self Care, 16109- Manual therapy, [phone]- Aquatic Therapy, G0283- Electrical stimulation (unattended), [phone]- Electrical stimulation (manual), [id number]- Ultrasound, 91478- Ionotophoresis 4mg /ml Dexamethasone, Patient/Family education, Taping, Dry Needling, Joint mobilization, Cryotherapy, and Moist heat  PLAN FOR NEXT SESSION: gentle active-assisted ROM left shoulder;  UE Ranger; pulleys; (healing left middle finger fracture);ball rolls floor or wall   Lavinia Sharps, PT 02/14/24 2:10 PM Phone: [phone] Fax: [phone]   [facility] [address], [address] [place], [place] [address] Phone # [phone] Fax [phone]

## 2024-02-17 ENCOUNTER — Ambulatory Visit: Admitting: Rehabilitative and Restorative Service Providers"

## 2024-02-17 ENCOUNTER — Encounter: Payer: Self-pay | Admitting: Rehabilitative and Restorative Service Providers"

## 2024-02-17 DIAGNOSIS — M25512 Pain in left shoulder: Secondary | ICD-10-CM

## 2024-02-17 DIAGNOSIS — M25612 Stiffness of left shoulder, not elsewhere classified: Secondary | ICD-10-CM

## 2024-02-17 DIAGNOSIS — M6281 Muscle weakness (generalized): Secondary | ICD-10-CM

## 2024-02-17 NOTE — Therapy (Signed)
 OUTPATIENT PHYSICAL THERAPY SHOULDER PROGRESS NOTE   Patient Name: Madison Tate MRN: [id number] DOB:January 06, 1951, 73 y.o., female Today's Date: 02/17/2024  END OF SESSION:  PT End of Session - 02/17/24 0935     Visit Number 5    Date for PT Re-Evaluation 03/28/24    Authorization Type Medicare/Tricare    Progress Note Due on Visit 10    PT Start Time 0930    PT Stop Time 1015    PT Time Calculation (min) 45 min    Activity Tolerance Patient limited by pain    Behavior During Therapy WFL for tasks assessed/performed             Past Medical History:  Diagnosis Date   Anxiety    Diabetes mellitus without complication (HCC)    Hypercholesteremia    Hypertension    TIA (transient ischemic attack)    Past Surgical History:  Procedure Laterality Date   ABDOMINAL HYSTERECTOMY     There are no active problems to display for this patient.   PCP: none  REFERRING PROVIDER: Mack Hook MD  REFERRING DIAG: left shoulder strain  THERAPY DIAG:  Left shoulder pain, left shoulder stiffness, weakness Rationale for Evaluation and Treatment: Rehabilitation  ONSET DATE: Jan 8  SUBJECTIVE:                                                                                                                                                                                      SUBJECTIVE STATEMENT: Patient states that she went to dinner with a friend last night and that helped some.  Patient states that she is feeling a little glum today.  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 Yes NPRS scale: 5/10 Pain location: upper arm Pain orientation: Left  PAIN TYPE: aching Pain  description: intermittent  Aggravating factors: overhead elevation; lying on it left side, putting on a jacket Relieving factors: heat, Motrin as needed   PRECAUTIONS: work can't go unless 10 hrs/week and no lift 25# ([employer])   WEIGHT BEARING RESTRICTIONS: No  FALLS:  Has patient fallen in last 6 months? Yes. Number of falls 1  LIVING ENVIRONMENT: Lives in: House/apartment   OCCUPATION: [employer] has to lift 50# (out of work until 6/6)  PLOF: Independent  PATIENT GOALS:decrease pain and strength back  NEXT MD VISIT: 4/22 Dr. Janee Morn  OBJECTIVE:  Note: Objective measures were completed at Evaluation unless otherwise noted.  DIAGNOSTIC FINDINGS:  11/23/23: CLINICAL DATA:  Walking her puppy any pulled her down, landing on left arm. Left arm in a sling. EXAM: LEFT SHOULDER COMPARISON:  None Available.   FINDINGS: Calcific densities or osseous fragments project inferior to the coracoid process on AP view and medial to the scapula on Y-view. These are indeterminate and may be chronic however acute fracture is difficult to exclude. Consider CT for further evaluation. 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   IMPRESSION: Calcific densities or osseous fragments project inferior to the coracoid process on AP view and medial to the scapula on Y-view. These are indeterminate and may be chronic however acute fracture is difficult to exclude. Consider CT for further evaluation. 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 CLINICAL DATA:  Recent fall with left arm pain, initial encounter   EXAM: LEFT HUMERUS - 2+ VIEW   COMPARISON:  None Available.   FINDINGS: Degenerative changes of the acromioclavicular joint are seen. No acute fracture or dislocation is noted. Mild olecranon spurring is noted.   IMPRESSION: No acute abnormality noted.  PATIENT SURVEYS:  Eval:  Quick Dash 63.6%  COGNITION: Overall cognitive status: Within functional limits for tasks  assessed     UPPER EXTREMITY ROM:   Active ROM Right eval Left eval 4/1 left  Shoulder flexion 160 140 Active assisted 150 degrees  Shoulder extension     Shoulder abduction 162 77 painful    Shoulder adduction     Shoulder internal rotation T10 L1   Shoulder external rotation 72 50 painful   Elbow flexion     Elbow extension     Wrist flexion     Wrist extension     Wrist ulnar deviation     Wrist radial deviation     Wrist pronation     Wrist supination     (Blank rows = not tested)  UPPER EXTREMITY MMT:  MMT Right eval Left eval  Shoulder flexion 5 3  Shoulder extension 5 4  Shoulder abduction 5 2+  Shoulder adduction    Shoulder internal rotation 5 3  Shoulder external rotation 5 3  Middle trapezius    Lower trapezius    Elbow flexion 5 4  Elbow extension 5 4  Wrist flexion    Wrist extension    Wrist ulnar deviation    Wrist radial deviation    Wrist pronation    Wrist supination    Grip strength (lbs)    (Blank rows = not tested)    TREATMENT DATE: 02/17/2024 Seated pulleys flexion and scaption 2 min each Seated green 3 way pball rollout x10 each direction Seated shrugs 10x Seated scapular retraction x10 Seated left biceps curl with 1# dumbbell 2x10 Seated cervical sidebend 10x bilat Seated cervical rotation 10x bilat Left UE Ranger seated 30x (x10 on 3 different steps) Standing beach ball flexion isometric 10x Standing beach ball external rotation isometric 10x  Standing triceps pressdown with yellow theraband 2x10 left Standing 4D scapular stabilization with light blue 2# plyoball x20 each bilat Cold pack to left shoulder x5 min    TREATMENT DATE: 02/14/2024 Moist heat prior to and concurrent with heat: UE Ranger seated 30x Seated Shrugs 8x Scap protraction/retractions 8x Seated bicep curls no resistance 10x Seated pulleys flexion and scaption 2 min each Standing beach ball flexion isometric 10x Standing beach ball external rotation  isometric 10x  Seated cervical sidebend 8x Seated cervical rotation 8x  Seated table slides flexion 8x, abduction 8x Standing pendulum circles 10x each way Therapeutic activity: reaching in all planes  TREATMENT DATE: 02/10/2024 Seated pulleys flexion and scaption 2 min each Seated green 3 way pball rollout x10 each direction Seated shrugs 10x Seated cervical sidebend 10x Seated cervical rotation 10x Seated thoracic extension with ball 10x Seated left biceps curl with 1# dumbbell 2x10 Left shoulder flexion (to tolerance) with 1# dumbbell x10 Pronation/supination combo with wrist flexion/extension 10x Yellow band left triceps 20x (open palm secondary to finger fracture) Standing lef shoulder extension with yellow tband 2x10 Railing slides with towel for flexion and scaption 2x10 each Standing 4 way left shoulder pendulum x20 each direction Cold pack to left shoulder x5 min at end of session    PATIENT EDUCATION: Education details: Educated patient on anatomy and physiology of current symptoms, prognosis, plan of care as well as initial self care strategies to promote recovery Person educated: Patient Education method: Explanation Education comprehension: verbalized understanding  HOME EXERCISE PROGRAM: Access Code: [email or web address] URL: [email or web address] Date: 02/01/2024 Prepared by: Lavinia Sharps  Exercises - Supine Shoulder Flexion Extension AAROM with Dowel  - 2 x daily - 7 x weekly - 1 sets - 10 reps - Supine Shoulder External Rotation AAROM with Dowel  - 2 x daily - 7 x weekly - 1 sets - 10 reps - Seated Single Arm Elbow Flexion with Dumbbell  - 2 x daily - 7 x weekly - 1 sets - 10 reps - Seated Scapular Retraction  - 2 x daily - 7 x weekly - 1 sets - 10 reps - Circular Shoulder Pendulum with Table Support  - 2 x daily - 7 x weekly - 1 sets - 10 reps  ASSESSMENT:  CLINICAL IMPRESSION: Ms Guardado presents to skilled PT with complaints of pain, but states that  overall, she can tell that her exercises are getting easier.  Patient able to progress through session with PT constantly monitoring.  By completion of session, pt did report some slight increase in soreness due to exercises.  Patient requires minimal cuing throughout for improved technique during exercises.  Patient agreeable to cold pack at end of session to left shoulder and she reported decreased pain with use of this.  Patient with great response to cold pack at end of session and states that pain went down to a 4.5/10.     OBJECTIVE IMPAIRMENTS: decreased activity tolerance, decreased ROM, decreased strength, impaired perceived functional ability, impaired UE functional use, and pain.   ACTIVITY LIMITATIONS: carrying, lifting, sleeping, reach over head, and hygiene/grooming  PARTICIPATION LIMITATIONS: meal prep, cleaning, laundry, and occupation  PERSONAL FACTORS: Past/current experiences, 1 comorbidity: DN, history of neck pain/vertigo, and grief over the recent loss of her long time partner  are also affecting patient's functional outcome.   REHAB POTENTIAL: Good  CLINICAL DECISION MAKING: Stable/uncomplicated  EVALUATION COMPLEXITY: Low   GOALS: Goals reviewed with patient? Yes  SHORT TERM GOALS: Target date: 02/29/2024  The patient will demonstrate knowledge of basic self care strategies and exercises to promote healing  Baseline: Goal status: Ongoing  2.  The patient will have improved shoulder elevation ROM to at least 150 degrees needed for grooming/dressing purposes as well as reaching high shelves  Baseline:  Goal status: Ongoing  3.  The patient will have grossly 4-/5 strength needed to lift and lower a 2# object from a high shelf  Baseline:  Goal status: INITIAL  4.  Patient will be able to carry a  light to medium shopping bag in left hand Baseline:  Goal status: INITIAL     LONG TERM GOALS: Target date: 03/28/2024   The patient will be independent in a  safe self progression of a home exercise program to promote further recovery of function  Baseline:  Goal status: INITIAL  2.  The patient will report a 75% improvement in pain levels with functional activities which are currently difficult including cooking, carrying things, grooming/dressing Baseline:  Goal status: INITIAL  3.  The patient will have improved shoulder elevation ROM to at least 155 degrees needed for grooming/dressing purposes as well as reaching high shelves  Baseline:  Goal status: INITIAL  4.  The patient will have grossly 4+/5 strength needed to lift and lower a 5# object from a high shelf  Baseline:  Goal status: INITIAL  5.  Quick DASH outcome score improved to   50%  indicating improved function with less pain Baseline:  Goal status: INITIAL   PLAN:  PT FREQUENCY: 2x/week  PT DURATION: 8 weeks  PLANNED INTERVENTIONS: 97164- PT Re-evaluation, 97110-Therapeutic exercises, 97530- Therapeutic activity, 97112- Neuromuscular re-education, 97535- Self Care, 16109- Manual therapy, [phone]- Aquatic Therapy, G0283- Electrical stimulation (unattended), [phone]- Electrical stimulation (manual), [id number]- Ultrasound, 91478- Ionotophoresis 4mg /ml Dexamethasone, Patient/Family education, Taping, Dry Needling, Joint mobilization, Cryotherapy, and Moist heat  PLAN FOR NEXT SESSION: gentle active-assisted ROM left shoulder;  UE Ranger; pulleys; (healing left middle finger fracture);ball rolls floor or wall   Reather Laurence, PT, DPT 02/17/24, 10:20 AM  [facility] [address] [place], [place] [address] Phone # [phone] Fax [phone]

## 2024-02-21 ENCOUNTER — Ambulatory Visit: Admitting: Physical Therapy

## 2024-02-21 DIAGNOSIS — M6281 Muscle weakness (generalized): Secondary | ICD-10-CM

## 2024-02-21 DIAGNOSIS — M25512 Pain in left shoulder: Secondary | ICD-10-CM

## 2024-02-21 DIAGNOSIS — M25612 Stiffness of left shoulder, not elsewhere classified: Secondary | ICD-10-CM

## 2024-02-21 NOTE — Therapy (Signed)
 OUTPATIENT PHYSICAL THERAPY SHOULDER PROGRESS NOTE   Patient Name: Madison Tate MRN: [id number] DOB:1951-05-19, 73 y.o., female Today's Date: 02/21/2024  END OF SESSION:  PT End of Session - 02/21/24 1358     Visit Number 6    Date for PT Re-Evaluation 03/28/24    Authorization Type Medicare/Tricare    Progress Note Due on Visit 10    PT Start Time 1400    PT Stop Time 1440    PT Time Calculation (min) 40 min    Activity Tolerance Patient tolerated treatment well             Past Medical History:  Diagnosis Date   Anxiety    Diabetes mellitus without complication (HCC)    Hypercholesteremia    Hypertension    TIA (transient ischemic attack)    Past Surgical History:  Procedure Laterality Date   ABDOMINAL HYSTERECTOMY     There are no active problems to display for this patient.   PCP: none  REFERRING PROVIDER: Mack Hook MD  REFERRING DIAG: left shoulder strain  THERAPY DIAG:  Left shoulder pain, left shoulder stiffness, weakness Rationale for Evaluation and Treatment: Rehabilitation  ONSET DATE: Jan 8  SUBJECTIVE:                                                                                                                                                                                      SUBJECTIVE STATEMENT: I slipped on some mud at 12:30 today.  I ache all over.  Overall pain is improving, the ex's are helping.   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 Yes NPRS scale: 4/10 Pain location: upper arm Pain orientation: Left  PAIN TYPE: aching Pain description: intermittent  Aggravating factors: overhead elevation; lying on it left side, putting on a jacket Relieving factors: heat, Motrin as needed   PRECAUTIONS: work  can't go unless 10 hrs/week and no lift 25# ([employer])   WEIGHT BEARING RESTRICTIONS: No  FALLS:  Has patient fallen in last 6 months? Yes. Number of falls 1  LIVING ENVIRONMENT: Lives in: House/apartment   OCCUPATION: [employer] has to lift 50# (out of work until 6/6)  PLOF: Independent  PATIENT GOALS:decrease pain and strength back  NEXT MD VISIT: 4/22 Dr. Janee Morn   OBJECTIVE:  Note: Objective measures were completed at Evaluation unless otherwise noted.  DIAGNOSTIC FINDINGS:  11/23/23: CLINICAL DATA:  Walking her puppy any pulled her down, landing  on left arm. Left arm in a sling. EXAM: LEFT SHOULDER COMPARISON:  None Available.   FINDINGS: Calcific densities or osseous fragments project inferior to the coracoid process on AP view and medial to the scapula on Y-view. These are indeterminate and may be chronic however acute fracture is difficult to exclude. Consider CT for further evaluation. 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   IMPRESSION: Calcific densities or osseous fragments project inferior to the coracoid process on AP view and medial to the scapula on Y-view. These are indeterminate and may be chronic however acute fracture is difficult to exclude. Consider CT for further evaluation. 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 CLINICAL DATA:  Recent fall with left arm pain, initial encounter   EXAM: LEFT HUMERUS - 2+ VIEW   COMPARISON:  None Available.   FINDINGS: Degenerative changes of the acromioclavicular joint are seen. No acute fracture or dislocation is noted. Mild olecranon spurring is noted.   IMPRESSION: No acute abnormality noted.  PATIENT SURVEYS:  Eval:  Quick Dash 63.6%  COGNITION: Overall cognitive status: Within functional limits for tasks assessed     UPPER EXTREMITY ROM:   Active ROM Right eval Left eval 4/1 left  Shoulder flexion 160 140 Active assisted 150 degrees  Shoulder extension     Shoulder abduction 162 77  painful    Shoulder adduction     Shoulder internal rotation T10 L1   Shoulder external rotation 72 50 painful   Elbow flexion     Elbow extension     Wrist flexion     Wrist extension     Wrist ulnar deviation     Wrist radial deviation     Wrist pronation     Wrist supination     (Blank rows = not tested)  UPPER EXTREMITY MMT:  MMT Right eval Left eval  Shoulder flexion 5 3  Shoulder extension 5 4  Shoulder abduction 5 2+  Shoulder adduction    Shoulder internal rotation 5 3  Shoulder external rotation 5 3  Middle trapezius    Lower trapezius    Elbow flexion 5 4  Elbow extension 5 4  Wrist flexion    Wrist extension    Wrist ulnar deviation    Wrist radial deviation    Wrist pronation    Wrist supination    Grip strength (lbs)    (Blank rows = not tested)  TREATMENT DATE: 02/21/2024 Heat concurrent with seated ex: Seated UE Ranger: flexion 50x UE Ranger seated 30x Pendulum circles 2# 20x Discussed strategies for getting up off the floor without pushing with affected arm Seated isometric yellow band with handle internal rotation 3 side steps 8x Seated isometric yellow band with handle external rotation  3 side steps 8x  Supine serratus punch 8x 2 sets of 8  Supine clocks 12/6:00; 9:00/3:00 2 sets of 8 Walking in hallway with normal arm swing UE Ranger on the wall: 1/4 way 8x, then 1/2 way up 8x Cold pack 3 min   TREATMENT DATE: 02/17/2024 Seated pulleys flexion and scaption 2 min each Seated green 3 way pball rollout x10 each direction Seated shrugs 10x Seated scapular retraction x10 Seated left biceps curl with 1# dumbbell 2x10 Seated cervical sidebend 10x bilat Seated cervical rotation 10x bilat Left UE Ranger seated 30x (x10 on 3 different steps) Standing beach ball flexion isometric 10x Standing beach ball external rotation isometric 10x  Standing triceps pressdown with yellow theraband 2x10 left Standing 4D scapular stabilization with light blue  2#  plyoball x20 each bilat Cold pack to left shoulder x5 min    PATIENT EDUCATION: Education details: Educated patient on anatomy and physiology of current symptoms, prognosis, plan of care as well as initial self care strategies to promote recovery Person educated: Patient Education method: Explanation Education comprehension: verbalized understanding  HOME EXERCISE PROGRAM: Access Code: [email or web address] URL: [email or web address] Date: 02/01/2024 Prepared by: Lavinia Sharps  Exercises - Supine Shoulder Flexion Extension AAROM with Dowel  - 2 x daily - 7 x weekly - 1 sets - 10 reps - Supine Shoulder External Rotation AAROM with Dowel  - 2 x daily - 7 x weekly - 1 sets - 10 reps - Seated Single Arm Elbow Flexion with Dumbbell  - 2 x daily - 7 x weekly - 1 sets - 10 reps - Seated Scapular Retraction  - 2 x daily - 7 x weekly - 1 sets - 10 reps - Circular Shoulder Pendulum with Table Support  - 2 x daily - 7 x weekly - 1 sets - 10 reps  ASSESSMENT:  CLINICAL IMPRESSION: Despite a fall 1 1/2 hours ago, Juel Burrow reports overall decreasing shoulder pain.  She is able to perform exercises (with a slight progression) with pain levels remaining low < 5/10.  Good response with modalities for pain control.  Therapist providing verbal cues to optimize technique with exercises in order to achieve the greatest benefit.      OBJECTIVE IMPAIRMENTS: decreased activity tolerance, decreased ROM, decreased strength, impaired perceived functional ability, impaired UE functional use, and pain.   ACTIVITY LIMITATIONS: carrying, lifting, sleeping, reach over head, and hygiene/grooming  PARTICIPATION LIMITATIONS: meal prep, cleaning, laundry, and occupation  PERSONAL FACTORS: Past/current experiences, 1 comorbidity: DN, history of neck pain/vertigo, and grief over the recent loss of her long time partner  are also affecting patient's functional outcome.   REHAB POTENTIAL: Good  CLINICAL DECISION  MAKING: Stable/uncomplicated  EVALUATION COMPLEXITY: Low   GOALS: Goals reviewed with patient? Yes  SHORT TERM GOALS: Target date: 02/29/2024  The patient will demonstrate knowledge of basic self care strategies and exercises to promote healing  Baseline: Goal status: Ongoing  2.  The patient will have improved shoulder elevation ROM to at least 150 degrees needed for grooming/dressing purposes as well as reaching high shelves  Baseline:  Goal status: Ongoing  3.  The patient will have grossly 4-/5 strength needed to lift and lower a 2# object from a high shelf  Baseline:  Goal status: INITIAL  4.  Patient will be able to carry a light to medium shopping bag in left hand Baseline:  Goal status: INITIAL     LONG TERM GOALS: Target date: 03/28/2024   The patient will be independent in a safe self progression of a home exercise program to promote further recovery of function  Baseline:  Goal status: INITIAL  2.  The patient will report a 75% improvement in pain levels with functional activities which are currently difficult including cooking, carrying things, grooming/dressing Baseline:  Goal status: INITIAL  3.  The patient will have improved shoulder elevation ROM to at least 155 degrees needed for grooming/dressing purposes as well as reaching high shelves  Baseline:  Goal status: INITIAL  4.  The patient will have grossly 4+/5 strength needed to lift and lower a 5# object from a high shelf  Baseline:  Goal status: INITIAL  5.  Quick DASH outcome score improved to   50%  indicating improved function with less pain Baseline:  Goal status: INITIAL   PLAN:  PT FREQUENCY: 2x/week  PT DURATION: 8 weeks  PLANNED INTERVENTIONS: 97164- PT Re-evaluation, 97110-Therapeutic exercises, 97530- Therapeutic activity, 97112- Neuromuscular re-education, 97535- Self Care, 95621- Manual therapy, [phone]- Aquatic Therapy, [phone]- Electrical stimulation (unattended), [phone]-  Electrical stimulation (manual), [id number]- Ultrasound, 84132- Ionotophoresis 4mg /ml Dexamethasone, Patient/Family education, Taping, Dry Needling, Joint mobilization, Cryotherapy, and Moist heat  PLAN FOR NEXT SESSION: check shoulder ROM next visit; active-assisted ROM left shoulder;  UE Ranger; pulleys; (healing left middle finger fracture);ball rolls floor or wall When healed, floor transfer practice; MD 4/22   Lavinia Sharps, PT 02/21/24 2:43 PM Phone: [phone] Fax: [phone]  [facility] [address] [place], [place] [address] Phone # [phone] Fax [phone]

## 2024-02-23 ENCOUNTER — Ambulatory Visit: Admitting: Physical Therapy

## 2024-02-23 DIAGNOSIS — M25512 Pain in left shoulder: Secondary | ICD-10-CM | POA: Diagnosis not present

## 2024-02-23 DIAGNOSIS — M6281 Muscle weakness (generalized): Secondary | ICD-10-CM

## 2024-02-23 DIAGNOSIS — M25612 Stiffness of left shoulder, not elsewhere classified: Secondary | ICD-10-CM

## 2024-02-23 NOTE — Therapy (Signed)
 OUTPATIENT PHYSICAL THERAPY SHOULDER PROGRESS NOTE   Patient Name: Madison Tate MRN: [id number] DOB:01-19-1951, 73 y.o., female Today's Date: 02/23/2024  END OF SESSION:  PT End of Session - 02/23/24 0926     Visit Number 7    Date for PT Re-Evaluation 03/28/24    Authorization Type Medicare/Tricare    Progress Note Due on Visit 10    PT Start Time 0927    PT Stop Time 1015    PT Time Calculation (min) 48 min    Activity Tolerance Patient tolerated treatment well             Past Medical History:  Diagnosis Date   Anxiety    Diabetes mellitus without complication (HCC)    Hypercholesteremia    Hypertension    TIA (transient ischemic attack)    Past Surgical History:  Procedure Laterality Date   ABDOMINAL HYSTERECTOMY     There are no active problems to display for this patient.   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 Yes NPRS scale: 3/10 Pain location: upper arm Pain orientation: Left  PAIN TYPE: aching Pain description: intermittent  Aggravating factors: overhead elevation; lying on it left side, putting on a jacket Relieving factors: heat, Motrin as needed   PRECAUTIONS: work  can't go unless 10 hrs/week and no lift 25# ([employer])   WEIGHT BEARING RESTRICTIONS: No  FALLS:  Has patient fallen in last 6 months? Yes. Number of falls 1  LIVING ENVIRONMENT: Lives in: House/apartment   OCCUPATION: [employer] has to lift 50# (out of work until 6/6)  PLOF: Independent  PATIENT GOALS:decrease pain and strength back  NEXT MD VISIT: 4/22 Dr. Janee Morn   OBJECTIVE:  Note: Objective measures were completed at Evaluation unless otherwise noted.  DIAGNOSTIC FINDINGS:  11/23/23: CLINICAL DATA:  Walking her puppy any pulled her down, landing on left arm.  Left arm in a sling. EXAM: LEFT SHOULDER COMPARISON:  None Available.   FINDINGS: Calcific densities or osseous fragments project inferior to the coracoid process on AP view and medial to the scapula on Y-view. These are indeterminate and may be chronic however acute fracture is difficult to exclude. Consider CT for further evaluation. 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   IMPRESSION: Calcific densities or osseous fragments project inferior to the coracoid process on AP view and medial to the scapula on Y-view. These are indeterminate and may be chronic however acute fracture is difficult to exclude. Consider CT for further evaluation. 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 CLINICAL DATA:  Recent fall with left arm pain, initial encounter   EXAM: LEFT HUMERUS - 2+ VIEW   COMPARISON:  None Available.   FINDINGS: Degenerative changes of the acromioclavicular joint are seen. No acute fracture or dislocation is noted. Mild olecranon spurring is noted.   IMPRESSION: No acute abnormality noted.  PATIENT SURVEYS:  Eval:  Quick Dash 63.6%  COGNITION: Overall cognitive status: Within functional limits for tasks assessed     UPPER EXTREMITY ROM:   Active ROM Right eval Left eval 4/1 left 4/10  Shoulder flexion 160 140 Active assisted 150 degrees 140 "comfortable" actively; active assisted 150   Shoulder extension      Shoulder abduction 162 77 painful   108  Shoulder adduction      Shoulder internal rotation T10 L1  L5 hurts a little   Shoulder external rotation 72 50 painful  56  Elbow flexion      Elbow extension      Wrist flexion      Wrist extension      Wrist ulnar deviation      Wrist radial deviation      Wrist pronation      Wrist supination      (Blank rows = not tested)  UPPER EXTREMITY MMT:  MMT Right eval Left eval  Shoulder flexion 5 3  Shoulder extension 5 4  Shoulder abduction 5 2+  Shoulder adduction    Shoulder internal rotation 5 3  Shoulder external rotation 5 3  Middle trapezius    Lower trapezius    Elbow flexion 5 4  Elbow extension 5 4  Wrist flexion    Wrist extension    Wrist ulnar deviation    Wrist radial deviation    Wrist pronation    Wrist supination    Grip strength (lbs)    (Blank rows = not tested)  TREATMENT DATE: 02/23/2024 Pendulum circles leaning on the counter 20x Standing red ball on mat table: ball roll circles, forward and back 10x each  Supine serratus punch with arm at 90 degrees 10x Supine arm at 90 degrees 10x each clock and counter clockwise Supine clocks with arm at 90 degrees: 12/6:00; 9:00/3:00 10x each Sidelying external rotation 10x Sidelying arm at 90 degrees 12/6:00, 9/3:00  10x each (felt it on the last 3) Seated isometric yellow band with handle internal rotation 3 small side steps 8x Seated isometric yellow band with handle external rotation  3 side small steps 8x  Standing purple ball on wall circles and up/down 10x  Shoulder ROM measurements as above Walking in hallway with normal arm swing UE Ranger on the wall:  1/2 way up 10x flexion, 10x abduction  Cold pack 3 min     02/21/2024 Heat concurrent with seated ex: Seated UE Ranger: flexion 50x UE Ranger seated 30x Pendulum circles 2#  20x Discussed strategies for getting up off the floor without pushing with affected arm Seated isometric  yellow band with handle internal rotation 3 side steps 8x Seated isometric yellow band with handle external rotation  3 side steps 8x  Supine serratus punch 8x 2 sets of 8  Supine clocks 12/6:00; 9:00/3:00 2 sets of 8 Walking in hallway with normal arm swing UE Ranger on the wall: 1/4 way 8x, then 1/2 way up 8x Cold pack 3 min   TREATMENT DATE: 02/17/2024 Seated pulleys flexion and scaption 2 min each Seated green 3 way pball rollout x10 each direction Seated shrugs 10x Seated scapular retraction x10 Seated left biceps curl with 1# dumbbell 2x10 Seated cervical sidebend 10x bilat Seated cervical rotation 10x bilat Left UE Ranger seated 30x (x10 on 3 different steps) Standing beach ball flexion isometric 10x Standing beach ball external rotation isometric 10x  Standing triceps pressdown with yellow theraband 2x10 left Standing 4D scapular stabilization with light blue 2# plyoball x20 each bilat Cold pack to left shoulder x5 min    PATIENT EDUCATION: Education details: Educated patient on anatomy and physiology of current symptoms, prognosis, plan of care as well as initial self care strategies to promote recovery Person educated: Patient Education method: Explanation Education comprehension: verbalized understanding  HOME EXERCISE PROGRAM: Access Code: [email or web address] URL: [email or web address] Date: 02/01/2024 Prepared by: Lavinia Sharps  Exercises - Supine Shoulder Flexion Extension AAROM with Dowel  - 2 x daily - 7 x weekly - 1 sets - 10 reps - Supine Shoulder External Rotation AAROM with Dowel  - 2 x daily - 7 x weekly - 1 sets - 10 reps - Seated Single Arm Elbow Flexion with Dumbbell  - 2 x daily - 7 x weekly - 1 sets - 10 reps - Seated Scapular Retraction  - 2 x daily - 7 x weekly - 1 sets - 10 reps - Circular Shoulder Pendulum with Table Support  - 2 x daily - 7 x weekly - 1 sets - 10 reps  ASSESSMENT:  CLINICAL IMPRESSION: Much improved shoulder abduction ROM  and decreasing overall shoulder pain. Able to progress exercises today with more active ROM/less assisted ROM with pain level remaining at a tolerable level.  Therapist providing verbal cues to optimize technique with  exercises in order to achieve the greatest benefit and limiting compensatory strategies.      OBJECTIVE IMPAIRMENTS: decreased activity tolerance, decreased ROM, decreased strength, impaired perceived functional ability, impaired UE functional use, and pain.   ACTIVITY LIMITATIONS: carrying, lifting, sleeping, reach over head, and hygiene/grooming  PARTICIPATION LIMITATIONS: meal prep, cleaning, laundry, and occupation  PERSONAL FACTORS: Past/current experiences, 1 comorbidity: DN, history of neck pain/vertigo, and grief over the recent loss of her long time partner  are also affecting patient's functional outcome.   REHAB POTENTIAL: Good  CLINICAL DECISION MAKING: Stable/uncomplicated  EVALUATION COMPLEXITY: Low   GOALS: Goals reviewed with patient? Yes  SHORT TERM GOALS: Target date: 02/29/2024  The patient will demonstrate knowledge of basic self care strategies and exercises to promote healing  Baseline: Goal status: met 4/10  2.  The patient will have improved shoulder elevation ROM to at least 150 degrees needed for grooming/dressing purposes as well as reaching high shelves  Baseline:  Goal status: Ongoing  3.  The patient will have grossly 4-/5 strength needed to lift and lower a 2# object from a high shelf  Baseline:  Goal status: ongoing 4.  Patient will be able to carry a light  to medium shopping bag in left hand Baseline:  Goal status: ongoing     LONG TERM GOALS: Target date: 03/28/2024   The patient will be independent in a safe self progression of a home exercise program to promote further recovery of function  Baseline:  Goal status: INITIAL  2.  The patient will report a 75% improvement in pain levels with functional activities which  are currently difficult including cooking, carrying things, grooming/dressing Baseline:  Goal status: INITIAL  3.  The patient will have improved shoulder elevation ROM to at least 155 degrees needed for grooming/dressing purposes as well as reaching high shelves  Baseline:  Goal status: INITIAL  4.  The patient will have grossly 4+/5 strength needed to lift and lower a 5# object from a high shelf  Baseline:  Goal status: INITIAL  5.  Quick DASH outcome score improved to   50%  indicating improved function with less pain Baseline:  Goal status: INITIAL   PLAN:  PT FREQUENCY: 2x/week  PT DURATION: 8 weeks  PLANNED INTERVENTIONS: 97164- PT Re-evaluation, 97110-Therapeutic exercises, 97530- Therapeutic activity, 97112- Neuromuscular re-education, 97535- Self Care, 95284- Manual therapy, [phone]- Aquatic Therapy, G0283- Electrical stimulation (unattended), [phone]- Electrical stimulation (manual), [id number]- Ultrasound, 25366- Ionotophoresis 4mg /ml Dexamethasone, Patient/Family education, Taping, Dry Needling, Joint mobilization, Cryotherapy, and Moist heat  PLAN FOR NEXT SESSION:  check STGS next week; UE Ranger; pulleys; (healing left middle finger fracture);ball rolls floor or wall When healed, floor transfer practice; MD 4/22   Lavinia Sharps, PT 02/23/24 10:10 AM Phone: [phone] Fax: [phone]  [facility] [address], [address] [place], [place] [address] Phone # [phone] Fax [phone]

## 2024-02-28 ENCOUNTER — Encounter: Payer: Self-pay | Admitting: Rehabilitative and Restorative Service Providers"

## 2024-02-28 ENCOUNTER — Ambulatory Visit: Admitting: Rehabilitative and Restorative Service Providers"

## 2024-02-28 DIAGNOSIS — M6281 Muscle weakness (generalized): Secondary | ICD-10-CM

## 2024-02-28 DIAGNOSIS — M25512 Pain in left shoulder: Secondary | ICD-10-CM

## 2024-02-28 DIAGNOSIS — M25612 Stiffness of left shoulder, not elsewhere classified: Secondary | ICD-10-CM

## 2024-02-28 NOTE — Therapy (Signed)
 OUTPATIENT PHYSICAL THERAPY SHOULDER PROGRESS NOTE   Patient Name: Madison Tate MRN: [id number] DOB:06-05-1951, 73 y.o., female Today's Date: 02/28/2024  END OF SESSION:  PT End of Session - 02/28/24 1357     Visit Number 8    Date for PT Re-Evaluation 03/28/24    Authorization Type Medicare/Tricare    Progress Note Due on Visit 10    PT Start Time 1354    PT Stop Time 1448    PT Time Calculation (min) 54 min    Activity Tolerance Patient tolerated treatment well    Behavior During Therapy WFL for tasks assessed/performed             Past Medical History:  Diagnosis Date   Anxiety    Diabetes mellitus without complication (HCC)    Hypercholesteremia    Hypertension    TIA (transient ischemic attack)    Past Surgical History:  Procedure Laterality Date   ABDOMINAL HYSTERECTOMY     There are no active problems to display for this patient.   PCP: none  REFERRING PROVIDER: Mack Hook MD  REFERRING DIAG: left shoulder strain  THERAPY DIAG:  Left shoulder pain, left shoulder stiffness, weakness Rationale for Evaluation and Treatment: Rehabilitation  ONSET DATE: Jan 8  SUBJECTIVE:                                                                                                                                                                                      SUBJECTIVE STATEMENT: Patient states that she is having 3/10 pain still today.  States that her stomach is bothering her more today, reports that she thinks that she will finally have to have her gall bladder taken out, reports that she sees the MD (Dr Doylene Canard) about that on May 7.  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 Yes NPRS scale: 3/10 Pain location: upper arm Pain  orientation: Left  PAIN TYPE: aching Pain description: intermittent  Aggravating factors: overhead elevation; lying on it left side, putting on a jacket Relieving factors: heat, Motrin as needed   PRECAUTIONS: work can't go unless 10 hrs/week and no lift 25# ([employer])   WEIGHT BEARING RESTRICTIONS: No  FALLS:  Has patient fallen in last 6 months? Yes. Number of falls 1  LIVING ENVIRONMENT: Lives in: House/apartment   OCCUPATION: [employer] has to lift 50# (out of work until 6/6)  PLOF: Independent  PATIENT GOALS:decrease pain and strength back  NEXT MD VISIT: 4/22 Dr. Janee Morn   OBJECTIVE:  Note: Objective measures were completed at Evaluation unless otherwise noted.  DIAGNOSTIC FINDINGS:  11/23/23: CLINICAL DATA:  Walking her puppy any pulled her down, landing on left arm. Left arm in a sling. EXAM: LEFT SHOULDER COMPARISON:  None Available.   FINDINGS: Calcific densities or osseous fragments project inferior to the coracoid process on AP view and medial to the scapula on Y-view. These are indeterminate and may be chronic however acute fracture is difficult to exclude. Consider CT for further evaluation. 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   IMPRESSION: Calcific densities or osseous fragments project inferior to the coracoid process on AP view and medial to the scapula on Y-view. These are indeterminate and may be chronic however acute fracture is difficult to exclude. Consider CT for further evaluation. 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 CLINICAL DATA:  Recent fall with left arm pain, initial encounter   EXAM: LEFT HUMERUS - 2+ VIEW   COMPARISON:  None Available.   FINDINGS: Degenerative changes of the acromioclavicular joint are seen. No acute fracture or dislocation is noted. Mild olecranon spurring is noted.   IMPRESSION: No acute abnormality noted.  PATIENT SURVEYS:  Eval:  Quick Dash 63.6% 02/28/2024:  Quick Dash   68.18%  COGNITION: Overall cognitive status: Within functional limits for tasks assessed     UPPER EXTREMITY ROM:   Active ROM Right eval Left eval 4/1 left 4/10  Shoulder flexion 160 140 Active assisted 150 degrees 140 "comfortable" actively; active assisted 150  Shoulder extension      Shoulder abduction 162 77 painful   108  Shoulder adduction      Shoulder internal rotation T10 L1  L5 hurts a little   Shoulder external rotation 72 50 painful  56  Elbow flexion      Elbow extension      Wrist flexion      Wrist extension      Wrist ulnar deviation      Wrist radial deviation      Wrist pronation      Wrist supination      (Blank rows = not tested)  UPPER EXTREMITY MMT:  MMT Right eval Left eval Left 02/28/24  Shoulder flexion 5 3 4   Shoulder extension 5 4 4   Shoulder abduction 5 2+ 3  Shoulder adduction     Shoulder internal rotation 5 3 4-  Shoulder external rotation 5 3 3+  Middle trapezius     Lower trapezius     Elbow flexion 5 4   Elbow extension 5 4   Wrist flexion     Wrist extension     Wrist ulnar deviation     Wrist radial deviation     Wrist pronation     Wrist supination     Grip strength (lbs)   25 lbs  (Blank rows = not tested)  TREATMENT DATE: 02/28/2024: Seated pulleys flexion and scaption 2 min each UE Ranger in cancer gym:  flexion and abduction for left shoulder.  2x10 each  Quick Dash 68.18 Standing yellow physioball roll up the wall 2x10 Supine serratus punch with 1# 2x10 bilat Supine shoulder flexion with 1# dumbbell 2x10 bilat Right sidelying shoulder ER with 1# dumbbell 2x10 left Right sidelying shoulder abduction  with 1# dumbbell 2x10 left Seated left biceps curl with 2# dumbbell 2x8 Standing left isometric yellow band with handle internal rotation 3 small side steps 8x Standing left isometric yellow band with handle  external rotation  3 side small steps 8x  Standing shoulder rows with yellow tband 2x10 Standing shoulder  extension with yellow tband 2x10 Seated green 3 way pball rollout x10 each direction   02/23/2024 Pendulum circles leaning on the counter 20x Standing red ball on mat table: ball roll circles, forward and back 10x each  Supine serratus punch with arm at 90 degrees 10x Supine arm at 90 degrees 10x each clock and counter clockwise Supine clocks with arm at 90 degrees: 12/6:00; 9:00/3:00 10x each Sidelying external rotation 10x Sidelying arm at 90 degrees 12/6:00, 9/3:00  10x each (felt it on the last 3) Seated isometric yellow band with handle internal rotation 3 small side steps 8x Seated isometric yellow band with handle external rotation  3 side small steps 8x  Standing purple ball on wall circles and up/down 10x  Shoulder ROM measurements as above Walking in hallway with normal arm swing UE Ranger on the wall:  1/2 way up 10x flexion, 10x abduction  Cold pack 3 min     02/21/2024 Heat concurrent with seated ex: Seated UE Ranger: flexion 50x UE Ranger seated 30x Pendulum circles 2# 20x Discussed strategies for getting up off the floor without pushing with affected arm Seated isometric yellow band with handle internal rotation 3 side steps 8x Seated isometric yellow band with handle external rotation  3 side steps 8x  Supine serratus punch 8x 2 sets of 8  Supine clocks 12/6:00; 9:00/3:00 2 sets of 8 Walking in hallway with normal arm swing UE Ranger on the wall: 1/4 way 8x, then 1/2 way up 8x Cold pack 3 min     PATIENT EDUCATION: Education details: Educated patient on anatomy and physiology of current symptoms, prognosis, plan of care as well as initial self care strategies to promote recovery Person educated: Patient Education method: Explanation Education comprehension: verbalized understanding  HOME EXERCISE PROGRAM: Access Code: [email or web address] URL: [email or web address] Date: 02/28/2024 Prepared by: Chaneta Comer Mellie Buccellato  Exercises - Supine Shoulder Flexion  Extension AAROM with Dowel  - 2 x daily - 7 x weekly - 1 sets - 10 reps - Supine Shoulder External Rotation AAROM with Dowel  - 2 x daily - 7 x weekly - 1 sets - 10 reps - Seated Single Arm Elbow Flexion with Dumbbell  - 2 x daily - 7 x weekly - 1 sets - 10 reps - Seated Scapular Retraction  - 2 x daily - 7 x weekly - 1 sets - 10 reps - Circular Shoulder Pendulum with Table Support  - 2 x daily - 7 x weekly - 1 sets - 10 reps - Single Arm Serratus Punches in Supine with Dumbbell  - 1 x daily - 7 x weekly - 2 sets - 10 reps - Supine Shoulder Flexion Extension Full Range AROM  - 1 x daily - 7 x weekly - 2 sets - 10 reps - Wall Clock  - 1 x daily - 7 x weekly - 1 sets - 5 reps - Brandt-Daroff Vestibular Exercise  - 1 x daily - 7 x weekly - 3-5 reps  ASSESSMENT:  CLINICAL IMPRESSION: Ms Nordell presents to skilled PT stating that she has still been unable to pick up things with her left hand, states that she dropped a sandwich at lunch recently.  She also reports that she cannot do tasks that require two hands, like raking.  Patient has made progress with her strength and was able to record a baseline grip strength measurement for patient's  left hand.  Following supine exercises, patient stated that she was having a slight headache and admitted that she had been told in the past that she may have "some kind of vertigo that causes migraines" when lying down. With questioning, patient states that she remembered hearing the word "crystals".  Educated patient about vestibular rehab and provided her with the [employer] exercise.  Educated patient that if she is not improving with her dizziness, she may benefit from speaking with her PCP for a referral to PT for vestibular rehab; she verbalizes her understanding.  Patient able to progress with strengthening exercises during session and provided patient with updated HEP.  Patient is still progressing with short-term goals.   OBJECTIVE IMPAIRMENTS: decreased  activity tolerance, decreased ROM, decreased strength, impaired perceived functional ability, impaired UE functional use, and pain.   ACTIVITY LIMITATIONS: carrying, lifting, sleeping, reach over head, and hygiene/grooming  PARTICIPATION LIMITATIONS: meal prep, cleaning, laundry, and occupation  PERSONAL FACTORS: Past/current experiences, 1 comorbidity: DN, history of neck pain/vertigo, and grief over the recent loss of her long time partner  are also affecting patient's functional outcome.   REHAB POTENTIAL: Good  CLINICAL DECISION MAKING: Stable/uncomplicated  EVALUATION COMPLEXITY: Low   GOALS: Goals reviewed with patient? Yes  SHORT TERM GOALS: Target date: 02/29/2024  The patient will demonstrate knowledge of basic self care strategies and exercises to promote healing  Baseline: Goal status: met 4/10  2.  The patient will have improved shoulder elevation ROM to at least 150 degrees needed for grooming/dressing purposes as well as reaching high shelves  Baseline:  Goal status: Ongoing  3.  The patient will have grossly 4-/5 strength needed to lift and lower a 2# object from a high shelf  Baseline:  Goal status: ongoing (see above)  4.  Patient will be able to carry a light to medium shopping bag in left hand Baseline:  Goal status: ongoing     LONG TERM GOALS: Target date: 03/28/2024   The patient will be independent in a safe self progression of a home exercise program to promote further recovery of function  Baseline:  Goal status: INITIAL  2.  The patient will report a 75% improvement in pain levels with functional activities which are currently difficult including cooking, carrying things, grooming/dressing Baseline:  Goal status: INITIAL  3.  The patient will have improved shoulder elevation ROM to at least 155 degrees needed for grooming/dressing purposes as well as reaching high shelves  Baseline:  Goal status: INITIAL  4.  The patient will have grossly  4+/5 strength needed to lift and lower a 5# object from a high shelf  Baseline:  Goal status: INITIAL  5.  Quick DASH outcome score improved to   50%  indicating improved function with less pain Baseline:  Goal status: INITIAL   PLAN:  PT FREQUENCY: 2x/week  PT DURATION: 8 weeks  PLANNED INTERVENTIONS: 97164- PT Re-evaluation, 97110-Therapeutic exercises, 97530- Therapeutic activity, 97112- Neuromuscular re-education, 97535- Self Care, 16109- Manual therapy, [phone]- Aquatic Therapy, G0283- Electrical stimulation (unattended), [phone]- Electrical stimulation (manual), [id number]- Ultrasound, 91478- Ionotophoresis 4mg /ml Dexamethasone, Patient/Family education, Taping, Dry Needling, Joint mobilization, Cryotherapy, and Moist heat  PLAN FOR NEXT SESSION:  UE Ranger; pulleys; (healing left middle finger fracture);ball rolls floor or wall When healed, floor transfer practice; MD 4/22   Reather Laurence, PT, DPT 02/28/24, 3:14 PM  [facility] [address] [place], [place] [address] Phone # [phone] Fax [phone]

## 2024-03-01 ENCOUNTER — Ambulatory Visit: Admitting: Physical Therapy

## 2024-03-01 DIAGNOSIS — M25612 Stiffness of left shoulder, not elsewhere classified: Secondary | ICD-10-CM

## 2024-03-01 DIAGNOSIS — M25512 Pain in left shoulder: Secondary | ICD-10-CM

## 2024-03-01 DIAGNOSIS — M6281 Muscle weakness (generalized): Secondary | ICD-10-CM

## 2024-03-01 NOTE — Therapy (Signed)
 OUTPATIENT PHYSICAL THERAPY SHOULDER PROGRESS NOTE   Patient Name: Madison Tate MRN: [id number] DOB:[birth year], 73 y.o., female Today's Date: 03/01/2024  END OF SESSION:  PT End of Session - 03/01/24 1016     Visit Number 9    Authorization Type Medicare/Tricare    Progress Note Due on Visit 10    PT Start Time 1016    PT Stop Time 1058    PT Time Calculation (min) 42 min    Activity Tolerance Patient tolerated treatment well             Past Medical History:  Diagnosis Date   Anxiety    Diabetes mellitus without complication (HCC)    Hypercholesteremia    Hypertension    TIA (transient ischemic attack)    Past Surgical History:  Procedure Laterality Date   ABDOMINAL HYSTERECTOMY     There are no active problems to display for this patient.   PCP: none  REFERRING PROVIDER: Rober Chimera Tate  REFERRING DIAG: left shoulder strain  THERAPY DIAG:  Left shoulder pain, left shoulder stiffness, weakness Rationale for Evaluation and Treatment: Rehabilitation  ONSET DATE: Jan 8  SUBJECTIVE:                                                                                                                                                                                      SUBJECTIVE STATEMENT: I don't want to do any lying down ex's. My head hurts/migraine triggered by lying down a certain way.  I just can't do those crystal ex's right now.    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 Yes NPRS scale: 3-4/10 Pain location: upper arm Pain orientation: Left  PAIN TYPE: aching Pain description: intermittent  Aggravating factors: overhead elevation; lying on it left side, putting on a jacket Relieving factors: heat, Motrin  as needed   PRECAUTIONS: work can't go unless 10 hrs/week and no lift 25# ([employer])   WEIGHT BEARING RESTRICTIONS: No  FALLS:  Has patient fallen in last 6 months? Yes. Number of falls 1  LIVING ENVIRONMENT: Lives in: House/apartment   OCCUPATION: [employer] has to lift 50# (out of work until 6/6)  PLOF: Independent  PATIENT GOALS:decrease pain and strength back  NEXT Tate VISIT: 4/22 Dr. Hildy Tate   OBJECTIVE:  Note: Objective measures were completed at Evaluation unless otherwise noted.  DIAGNOSTIC FINDINGS:  11/23/23: CLINICAL DATA:  Walking her  puppy any pulled her down, landing on left arm. Left arm in a sling. EXAM: LEFT SHOULDER COMPARISON:  None Available.   FINDINGS: Calcific densities or osseous fragments project inferior to the coracoid process on AP view and medial to the scapula on Y-view. These are indeterminate and may be chronic however acute fracture is difficult to exclude. Consider CT for further evaluation. 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   IMPRESSION: Calcific densities or osseous fragments project inferior to the coracoid process on AP view and medial to the scapula on Y-view. These are indeterminate and may be chronic however acute fracture is difficult to exclude. Consider CT for further evaluation. 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 CLINICAL DATA:  Recent fall with left arm pain, initial encounter   EXAM: LEFT HUMERUS - 2+ VIEW   COMPARISON:  None Available.   FINDINGS: Degenerative changes of the acromioclavicular joint are seen. No acute fracture or dislocation is noted. Mild olecranon spurring is noted.   IMPRESSION: No acute abnormality noted.  PATIENT SURVEYS:  Eval:  Quick Dash 63.6% 02/28/2024:  Quick Dash  68.18%  COGNITION: Overall cognitive status: Within functional limits for tasks assessed     UPPER EXTREMITY ROM:   Active ROM Right eval Left eval 4/1 left 4/10  Shoulder flexion 160 140 Active  assisted 150 degrees 140 "comfortable" actively; active assisted 150  Shoulder extension      Shoulder abduction 162 77 painful   108  Shoulder adduction      Shoulder internal rotation T10 L1  L5 hurts a little   Shoulder external rotation 72 50 painful  56  Elbow flexion      Elbow extension      Wrist flexion      Wrist extension      Wrist ulnar deviation      Wrist radial deviation      Wrist pronation      Wrist supination      (Blank rows = not tested)  UPPER EXTREMITY MMT:  MMT Right eval Left eval Left 02/28/24  Shoulder flexion 5 3 4   Shoulder extension 5 4 4   Shoulder abduction 5 2+ 3  Shoulder adduction     Shoulder internal rotation 5 3 4-  Shoulder external rotation 5 3 3+  Middle trapezius     Lower trapezius     Elbow flexion 5 4   Elbow extension 5 4   Wrist flexion     Wrist extension     Wrist ulnar deviation     Wrist radial deviation     Wrist pronation     Wrist supination     Grip strength (lbs)   25 lbs  (Blank rows = not tested)  TREATMENT DATE: 03/01/2024: Seated UE Ranger warm up 30x Towel slides up gym railing 15x Seated landmine press press red band wrapped around trunk 15x Seated dowel overhead press 12x (challenging but OK) Standing left isometric red band with handle internal rotation 3 small side steps 8x Standing left isometric red band with handle external rotation  3 side small steps 8x  Standing shoulder rows with red tband x15 Standing shoulder extension with red tband x15 Seated green 3 way pball rollout x10 each direction UE Ranger in cancer gym:  flexion and abduction for left shoulder.  2x10 each  Seated left biceps curl with 2 1/2 cuff weight hold in palm x8 (challenging but OK)  02/28/2024: Seated pulleys flexion and scaption 2 min each UE Ranger in  cancer gym:  flexion and abduction for left shoulder.  2x10 each  Quick Dash 68.18 Standing yellow physioball roll up the wall 2x10 Supine serratus punch with 1# 2x10  bilat Supine shoulder flexion with 1# dumbbell 2x10 bilat Right sidelying shoulder ER with 1# dumbbell 2x10 left Right sidelying shoulder abduction  with 1# dumbbell 2x10 left Seated left biceps curl with 2# dumbbell 2x8 Standing left isometric yellow band with handle internal rotation 3 small side steps 8x Standing left isometric yellow band with handle external rotation  3 side small steps 8x  Standing shoulder rows with yellow tband 2x10 Standing shoulder extension with yellow tband 2x10 Seated green 3 way pball rollout x10 each direction   02/23/2024 Pendulum circles leaning on the counter 20x Standing red ball on mat table: ball roll circles, forward and back 10x each  Supine serratus punch with arm at 90 degrees 10x Supine arm at 90 degrees 10x each clock and counter clockwise Supine clocks with arm at 90 degrees: 12/6:00; 9:00/3:00 10x each Sidelying external rotation 10x Sidelying arm at 90 degrees 12/6:00, 9/3:00  10x each (felt it on the last 3) Seated isometric yellow band with handle internal rotation 3 small side steps 8x Seated isometric yellow band with handle external rotation  3 side small steps 8x  Standing purple ball on wall circles and up/down 10x  Shoulder ROM measurements as above Walking in hallway with normal arm swing UE Ranger on the wall:  1/2 way up 10x flexion, 10x abduction  Cold pack 3 min      PATIENT EDUCATION: Education details: Educated patient on anatomy and physiology of current symptoms, prognosis, plan of care as well as initial self care strategies to promote recovery Person educated: Patient Education method: Explanation Education comprehension: verbalized understanding  HOME EXERCISE PROGRAM: Access Code: [email or web address] URL: [email or web address] Date: 02/28/2024 Prepared by: Chaneta Comer Menke  Exercises - Supine Shoulder Flexion Extension AAROM with Dowel  - 2 x daily - 7 x weekly - 1 sets - 10 reps - Supine Shoulder External  Rotation AAROM with Dowel  - 2 x daily - 7 x weekly - 1 sets - 10 reps - Seated Single Arm Elbow Flexion with Dumbbell  - 2 x daily - 7 x weekly - 1 sets - 10 reps - Seated Scapular Retraction  - 2 x daily - 7 x weekly - 1 sets - 10 reps - Circular Shoulder Pendulum with Table Support  - 2 x daily - 7 x weekly - 1 sets - 10 reps - Single Arm Serratus Punches in Supine with Dumbbell  - 1 x daily - 7 x weekly - 2 sets - 10 reps - Supine Shoulder Flexion Extension Full Range AROM  - 1 x daily - 7 x weekly - 2 sets - 10 reps - Wall Clock  - 1 x daily - 7 x weekly - 1 sets - 5 reps - Brandt-Daroff Vestibular Exercise  - 1 x daily - 7 x weekly - 3-5 reps  ASSESSMENT:  CLINICAL IMPRESSION: Avoided lying down ex's today at patient's request secondary to a migraine she feels is triggered with this positioning.  Decreasing shoulder pain although still sensitive with shoulder abduction.  Forward elevation steadily improving.  Able to increased resistance band intensity to medium level.  Therapist providing verbal cues to optimize technique with  exercises.   OBJECTIVE IMPAIRMENTS: decreased activity tolerance, decreased ROM, decreased strength, impaired perceived functional ability, impaired UE functional use, and pain.   ACTIVITY LIMITATIONS:  carrying, lifting, sleeping, reach over head, and hygiene/grooming  PARTICIPATION LIMITATIONS: meal prep, cleaning, laundry, and occupation  PERSONAL FACTORS: Past/current experiences, 1 comorbidity: DN, history of neck pain/vertigo, and grief over the recent loss of her long time partner  are also affecting patient's functional outcome.   REHAB POTENTIAL: Good  CLINICAL DECISION MAKING: Stable/uncomplicated  EVALUATION COMPLEXITY: Low   GOALS: Goals reviewed with patient? Yes  SHORT TERM GOALS: Target date: 02/29/2024  The patient will demonstrate knowledge of basic self care strategies and exercises to promote healing  Baseline: Goal status: met  4/10  2.  The patient will have improved shoulder elevation ROM to at least 150 degrees needed for grooming/dressing purposes as well as reaching high shelves  Baseline:  Goal status: Ongoing  3.  The patient will have grossly 4-/5 strength needed to lift and lower a 2# object from a high shelf  Baseline:  Goal status: ongoing (see above)  4.  Patient will be able to carry a light to medium shopping bag in left hand Baseline:  Goal status: ongoing     LONG TERM GOALS: Target date: 03/28/2024   The patient will be independent in a safe self progression of a home exercise program to promote further recovery of function  Baseline:  Goal status: INITIAL  2.  The patient will report a 75% improvement in pain levels with functional activities which are currently difficult including cooking, carrying things, grooming/dressing Baseline:  Goal status: INITIAL  3.  The patient will have improved shoulder elevation ROM to at least 155 degrees needed for grooming/dressing purposes as well as reaching high shelves  Baseline:  Goal status: INITIAL  4.  The patient will have grossly 4+/5 strength needed to lift and lower a 5# object from a high shelf  Baseline:  Goal status: INITIAL  5.  Quick DASH outcome score improved to   50%  indicating improved function with less pain Baseline:  Goal status: INITIAL   PLAN:  PT FREQUENCY: 2x/week  PT DURATION: 8 weeks  PLANNED INTERVENTIONS: 97164- PT Re-evaluation, 97110-Therapeutic exercises, 97530- Therapeutic activity, 97112- Neuromuscular re-education, 97535- Self Care, 16109- Manual therapy, [phone]- Aquatic Therapy, U9811- Electrical stimulation (unattended), [phone]- Electrical stimulation (manual), [id number]- Ultrasound, 29562- Ionotophoresis 4mg /ml Dexamethasone, Patient/Family education, Taping, Dry Needling, Joint mobilization, Cryotherapy, and Moist heat  PLAN FOR NEXT SESSION:10th visit progress note; check ROM; try lifts to/from shelves  for functional strengthening; UE Ranger; pulleys; (healing left middle finger fracture);ball rolls floor or wall When healed, floor transfer practice; Tate 4/22  Darien Eden, PT 03/01/24 11:02 AM Phone: [phone] Fax: [phone]  [facility] [address] [place], [place] [address] Phone # [phone] Fax [phone]

## 2024-03-06 ENCOUNTER — Ambulatory Visit: Admitting: Physical Therapy

## 2024-03-06 DIAGNOSIS — M25612 Stiffness of left shoulder, not elsewhere classified: Secondary | ICD-10-CM

## 2024-03-06 DIAGNOSIS — M6281 Muscle weakness (generalized): Secondary | ICD-10-CM

## 2024-03-06 DIAGNOSIS — M25512 Pain in left shoulder: Secondary | ICD-10-CM | POA: Diagnosis not present

## 2024-03-06 NOTE — Therapy (Signed)
 OUTPATIENT PHYSICAL THERAPY SHOULDER PROGRESS NOTE   Patient Name: Madison Tate MRN: [id number] DOB:16-Aug-1951, 73 y.o., female Today's Date: 03/06/2024   Progress Note Reporting Period 02/01/2024 to 03/06/2024  See note below for Objective Data and Assessment of Progress/Goals.     END OF SESSION:  PT End of Session - 03/06/24 1358     Visit Number 10    Date for PT Re-Evaluation 03/28/24    Authorization Type Medicare/Tricare    Progress Note Due on Visit 20    PT Start Time 1400    PT Stop Time 1440    PT Time Calculation (min) 40 min    Activity Tolerance Patient tolerated treatment well             Past Medical History:  Diagnosis Date   Anxiety    Diabetes mellitus without complication (HCC)    Hypercholesteremia    Hypertension    TIA (transient ischemic attack)    Past Surgical History:  Procedure Laterality Date   ABDOMINAL HYSTERECTOMY     There are no active problems to display for this patient.   PCP: none  REFERRING PROVIDER: Rober Chimera MD  REFERRING DIAG: left shoulder strain  THERAPY DIAG:  Left shoulder pain, left shoulder stiffness, weakness Rationale for Evaluation and Treatment: Rehabilitation  ONSET DATE: Jan 8  SUBJECTIVE:                                                                                                                                                                                      SUBJECTIVE STATEMENT: Saw the surgeon and I'm going back to work tomorrow.  But he said to continue with PT.    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 Yes NPRS scale: 3/10 Pain location: upper arm Pain orientation: Left  PAIN TYPE: aching Pain description: intermittent   Aggravating factors: overhead elevation; lying on it left side, putting on a jacket Relieving factors: heat, Motrin  as needed   PRECAUTIONS: work can't go unless 10 hrs/week and no lift 25# ([employer])   WEIGHT BEARING RESTRICTIONS: No  FALLS:  Has patient fallen in last 6 months? Yes. Number of falls 1  LIVING ENVIRONMENT: Lives in: House/apartment   OCCUPATION: [employer] has to lift 50# (out of work until 6/6)  PLOF: Independent  PATIENT GOALS:decrease pain and strength back  NEXT MD VISIT: Dr. Hildy Lowers as needed  OBJECTIVE:  Note: Objective measures were completed at Evaluation unless otherwise noted.  DIAGNOSTIC FINDINGS:  11/23/23: CLINICAL DATA:  Walking her puppy any pulled her down, landing on left arm. Left arm in a sling. EXAM: LEFT SHOULDER COMPARISON:  None Available.   FINDINGS: Calcific densities or osseous fragments project inferior to the coracoid process on AP view and medial to the scapula on Y-view. These are indeterminate and may be chronic however acute fracture is difficult to exclude. Consider CT for further evaluation. 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   IMPRESSION: Calcific densities or osseous fragments project inferior to the coracoid process on AP view and medial to the scapula on Y-view. These are indeterminate and may be chronic however acute fracture is difficult to exclude. Consider CT for further evaluation. 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 CLINICAL DATA:  Recent fall with left arm pain, initial encounter   EXAM: LEFT HUMERUS - 2+ VIEW   COMPARISON:  None Available.   FINDINGS: Degenerative changes of the acromioclavicular joint are seen. No acute fracture or dislocation is noted. Mild olecranon spurring is noted.   IMPRESSION: No acute abnormality noted.  PATIENT SURVEYS:  Eval:  Quick Dash 63.6% 02/28/2024:  Quick Dash  68.18%  COGNITION: Overall cognitive status: Within functional limits for tasks  assessed     UPPER EXTREMITY ROM:   Active ROM Right eval Left eval 4/1 left 4/10 4/22  Shoulder flexion 160 140 Active assisted 150 degrees 140 "comfortable" actively; active assisted 150 132 active  Shoulder extension       Shoulder abduction 162 77 painful   108 95 painful   Shoulder adduction       Shoulder internal rotation T10 L1  L5 hurts a little  Thumb to L5  Shoulder external rotation 72 50 painful  56 57  Elbow flexion       Elbow extension       Wrist flexion       Wrist extension       Wrist ulnar deviation       Wrist radial deviation       Wrist pronation       Wrist supination       (Blank rows = not tested)  UPPER EXTREMITY MMT:  MMT Right eval Left eval Left 02/28/24  Shoulder flexion 5 3 4   Shoulder extension 5 4 4   Shoulder abduction 5 2+ 3  Shoulder adduction     Shoulder internal rotation 5 3 4-  Shoulder external rotation 5 3 3+  Middle trapezius     Lower trapezius     Elbow flexion 5 4   Elbow extension 5 4   Wrist flexion     Wrist extension     Wrist ulnar deviation     Wrist radial deviation     Wrist pronation     Wrist supination     Grip strength (lbs)   25 lbs  (Blank rows = not tested)  TREATMENT DATE: 03/06/2024: UBE 3 min total forward/backward Seated green 3 way pball rollout x10 each direction Seated landmine press cane on mat table pink power cord anchored underneath her foot for moderate resistance 15x Seated dowel overhead press 10x  Abduction from elevated table 60 and 70 degrees 5 reps each  Seated left biceps curl with 2 1/2 # cuff weight hold in palm x8  Green band lat pulls 10x Farmer's carry 3# 2 laps  Cones to low and middle shelf 2x5 1# to low and middle  shelves 5x each UE Ranger on wall in cancer gym 2/3 elevation:  flexion and abduction for left shoulder.  2x10 each   03/01/2024: Seated UE Ranger warm up 30x Towel slides up gym railing 15x Seated landmine press press red band wrapped around trunk  15x Seated dowel overhead press 12x (challenging but OK) Standing left isometric red band with handle internal rotation 3 small side steps 8x Standing left isometric red band with handle external rotation  3 side small steps 8x  Standing shoulder rows with red tband x15 Standing shoulder extension with red tband x15 Seated green 3 way pball rollout x10 each direction UE Ranger in cancer gym:  flexion and abduction for left shoulder.  2x10 each  Seated left biceps curl with 2 1/2 cuff weight hold in palm x8 (challenging but OK)  02/28/2024: Seated pulleys flexion and scaption 2 min each UE Ranger in cancer gym:  flexion and abduction for left shoulder.  2x10 each  Quick Dash 68.18 Standing yellow physioball roll up the wall 2x10 Supine serratus punch with 1# 2x10 bilat Supine shoulder flexion with 1# dumbbell 2x10 bilat Right sidelying shoulder ER with 1# dumbbell 2x10 left Right sidelying shoulder abduction  with 1# dumbbell 2x10 left Seated left biceps curl with 2# dumbbell 2x8 Standing left isometric yellow band with handle internal rotation 3 small side steps 8x Standing left isometric yellow band with handle external rotation  3 side small steps 8x  Standing shoulder rows with yellow tband 2x10 Standing shoulder extension with yellow tband 2x10 Seated green 3 way pball rollout x10 each direction     PATIENT EDUCATION: Education details: Educated patient on anatomy and physiology of current symptoms, prognosis, plan of care as well as initial self care strategies to promote recovery Person educated: Patient Education method: Explanation Education comprehension: verbalized understanding  HOME EXERCISE PROGRAM: Access Code: [email or web address] URL: [email or web address] Date: 02/28/2024 Prepared by: Chaneta Comer Menke  Exercises - Supine Shoulder Flexion Extension AAROM with Dowel  - 2 x daily - 7 x weekly - 1 sets - 10 reps - Supine Shoulder External Rotation AAROM with  Dowel  - 2 x daily - 7 x weekly - 1 sets - 10 reps - Seated Single Arm Elbow Flexion with Dumbbell  - 2 x daily - 7 x weekly - 1 sets - 10 reps - Seated Scapular Retraction  - 2 x daily - 7 x weekly - 1 sets - 10 reps - Circular Shoulder Pendulum with Table Support  - 2 x daily - 7 x weekly - 1 sets - 10 reps - Single Arm Serratus Punches in Supine with Dumbbell  - 1 x daily - 7 x weekly - 2 sets - 10 reps - Supine Shoulder Flexion Extension Full Range AROM  - 1 x daily - 7 x weekly - 2 sets - 10 reps - Wall Clock  - 1 x daily - 7 x weekly - 1 sets - 5 reps - Brandt-Daroff Vestibular Exercise  - 1 x daily - 7 x weekly - 3-5 reps  ASSESSMENT:  CLINICAL IMPRESSION: Improving with shoulder ROM although abduction still very painful.  Increased functional reaching and carrying activities today with mild increase in pain but "tolerable". The patient would benefit from a continuation of skilled PT for a further progression of strengthening and functional mobility needed for return to work tomorrow.  Will continue to update and promote independence in a HEP needed for a return to the highest functional level possible with ADLs.  OBJECTIVE IMPAIRMENTS: decreased activity tolerance, decreased ROM, decreased strength, impaired perceived functional ability, impaired UE functional use, and pain.   ACTIVITY LIMITATIONS: carrying, lifting, sleeping, reach over head, and hygiene/grooming  PARTICIPATION LIMITATIONS: meal prep, cleaning, laundry, and occupation  PERSONAL FACTORS: Past/current experiences, 1 comorbidity: DN, history of neck pain/vertigo, and grief over the recent loss of her long time partner  are also affecting patient's functional outcome.   REHAB POTENTIAL: Good  CLINICAL DECISION MAKING: Stable/uncomplicated  EVALUATION COMPLEXITY: Low   GOALS: Goals reviewed with patient? Yes  SHORT TERM GOALS: Target date: 02/29/2024  The patient will demonstrate knowledge of basic self  care strategies and exercises to promote healing  Baseline: Goal status: met 4/10  2.  The patient will have improved shoulder elevation ROM to at least 150 degrees needed for grooming/dressing purposes as well as reaching high shelves  Baseline:  Goal status: Ongoing  3.  The patient will have grossly 4-/5 strength needed to lift and lower a 2# object from a high shelf  Baseline:  Goal status: ongoing (see above)  4.  Patient will be able to carry a light to medium shopping bag in left hand Baseline:  Goal status: met 4/22    LONG TERM GOALS: Target date: 03/28/2024   The patient will be independent in a safe self progression of a home exercise program to promote further recovery of function  Baseline:  Goal status: INITIAL  2.  The patient will report a 75% improvement in pain levels with functional activities which are currently difficult including cooking, carrying things, grooming/dressing Baseline:  Goal status: INITIAL  3.  The patient will have improved shoulder elevation ROM to at least 155 degrees needed for grooming/dressing purposes as well as reaching high shelves  Baseline:  Goal status: INITIAL  4.  The patient will have grossly 4+/5 strength needed to lift and lower a 5# object from a high shelf  Baseline:  Goal status: INITIAL  5.  Quick DASH outcome score improved to   50%  indicating improved function with less pain Baseline:  Goal status: INITIAL   PLAN:  PT FREQUENCY: 2x/week  PT DURATION: 8 weeks  PLANNED INTERVENTIONS: 97164- PT Re-evaluation, 97110-Therapeutic exercises, 97530- Therapeutic activity, 97112- Neuromuscular re-education, 97535- Self Care, 08657- Manual therapy, [phone]- Aquatic Therapy, E9528- Electrical stimulation (unattended), [phone]- Electrical stimulation (manual), 97035- Ultrasound, 40102- Ionotophoresis 4mg /ml Dexamethasone, Patient/Family education, Taping, Dry Needling, Joint mobilization, Cryotherapy, and Moist heat  PLAN  FOR NEXT SESSION: see how return to work goes tomorrow; abduction from elevated table; farmer carries; lifts to/from shelves for functional strengthening; UE Ranger; pulleys; (healing left middle finger fracture);ball rolls floor or wall When healed, floor transfer practice  Darien Eden, PT 03/06/24 6:52 PM Phone: [phone] Fax: [phone]  [facility] [address] [place], [place] [address] Phone # [phone] Fax [phone]

## 2024-03-08 ENCOUNTER — Ambulatory Visit: Admitting: Physical Therapy

## 2024-03-08 DIAGNOSIS — M25512 Pain in left shoulder: Secondary | ICD-10-CM | POA: Diagnosis not present

## 2024-03-08 DIAGNOSIS — M6281 Muscle weakness (generalized): Secondary | ICD-10-CM

## 2024-03-08 DIAGNOSIS — M25612 Stiffness of left shoulder, not elsewhere classified: Secondary | ICD-10-CM

## 2024-03-08 NOTE — Therapy (Signed)
 OUTPATIENT PHYSICAL THERAPY SHOULDER PROGRESS NOTE   Patient Name: Madison Tate MRN: [id number] DOB:[birth year], 73 y.o., female Today's Date: 03/08/2024    END OF SESSION:  PT End of Session - 03/08/24 1018     Visit Number 11    Date for PT Re-Evaluation 03/28/24    Authorization Type Medicare/Tricare    Progress Note Due on Visit 20    PT Start Time 1017    PT Stop Time 1057    PT Time Calculation (min) 40 min    Activity Tolerance Patient tolerated treatment well             Past Medical History:  Diagnosis Date   Anxiety    Diabetes mellitus without complication (HCC)    Hypercholesteremia    Hypertension    TIA (transient ischemic attack)    Past Surgical History:  Procedure Laterality Date   ABDOMINAL HYSTERECTOMY     There are no active problems to display for this patient.   PCP: none  REFERRING PROVIDER: Rober Chimera MD  REFERRING DIAG: left shoulder strain  THERAPY DIAG:  Left shoulder pain, left shoulder stiffness, weakness Rationale for Evaluation and Treatment: Rehabilitation  ONSET DATE: Jan 8  SUBJECTIVE:                                                                                                                                                                                      SUBJECTIVE STATEMENT: Returned to work yesterday.  It went better than I thought.  Did 6 hours.  There were a few times when I said ouch.  She reports it went pretty well but she was unable to lower a 35# box down and had some pain with crossing her arm (horizontal adduction). Works again on the 29th.    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 Yes NPRS scale: 6/10 Pain location: upper arm Pain orientation:  Left  PAIN TYPE: aching Pain description: intermittent  Aggravating factors: overhead elevation; lying on it left side, putting on a jacket Relieving factors: heat, Motrin  as needed   PRECAUTIONS: work can't go unless 10 hrs/week and no lift 25# ([employer])   WEIGHT BEARING RESTRICTIONS: No  FALLS:  Has patient fallen in last 6 months? Yes. Number of falls 1  LIVING ENVIRONMENT: Lives in: House/apartment   OCCUPATION: [employer] has to lift 50# (out of work until 6/6)  PLOF: Independent  PATIENT GOALS:decrease pain and strength back  NEXT MD VISIT: Dr. Hildy Lowers as needed  OBJECTIVE:  Note: Objective measures were completed at Evaluation unless otherwise noted.  DIAGNOSTIC FINDINGS:  11/23/23: CLINICAL DATA:  Walking her puppy any pulled her down, landing on left arm. Left arm in a sling. EXAM: LEFT SHOULDER COMPARISON:  None Available.   FINDINGS: Calcific densities or osseous fragments project inferior to the coracoid process on AP view and medial to the scapula on Y-view. These are indeterminate and may be chronic however acute fracture is difficult to exclude. Consider CT for further evaluation. 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   IMPRESSION: Calcific densities or osseous fragments project inferior to the coracoid process on AP view and medial to the scapula on Y-view. These are indeterminate and may be chronic however acute fracture is difficult to exclude. Consider CT for further evaluation. 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 CLINICAL DATA:  Recent fall with left arm pain, initial encounter   EXAM: LEFT HUMERUS - 2+ VIEW   COMPARISON:  None Available.   FINDINGS: Degenerative changes of the acromioclavicular joint are seen. No acute fracture or dislocation is noted. Mild olecranon spurring is noted.   IMPRESSION: No acute abnormality noted.  PATIENT SURVEYS:  Eval:  Quick Dash 63.6% 02/28/2024:  Quick Dash  68.18%  COGNITION: Overall  cognitive status: Within functional limits for tasks assessed     UPPER EXTREMITY ROM:   Active ROM Right eval Left eval 4/1 left 4/10 4/22  Shoulder flexion 160 140 Active assisted 150 degrees 140 "comfortable" actively; active assisted 150 132 active  Shoulder extension       Shoulder abduction 162 77 painful   108 95 painful   Shoulder adduction       Shoulder internal rotation T10 L1  L5 hurts a little  Thumb to L5  Shoulder external rotation 72 50 painful  56 57  Elbow flexion       Elbow extension       Wrist flexion       Wrist extension       Wrist ulnar deviation       Wrist radial deviation       Wrist pronation       Wrist supination       (Blank rows = not tested)  UPPER EXTREMITY MMT:  MMT Right eval Left eval Left 02/28/24  Shoulder flexion 5 3 4   Shoulder extension 5 4 4   Shoulder abduction 5 2+ 3  Shoulder adduction     Shoulder internal rotation 5 3 4-  Shoulder external rotation 5 3 3+  Middle trapezius     Lower trapezius     Elbow flexion 5 4   Elbow extension 5 4   Wrist flexion     Wrist extension     Wrist ulnar deviation     Wrist radial deviation     Wrist pronation     Wrist supination     Grip strength (lbs)   25 lbs  (Blank rows = not tested)  TREATMENT DATE: 03/08/2024: UBE 5 min total forward/backward 80% of the work with right (unaffected side) Seated green 3 way pball rollout x10 each direction Seated landmine press cane on chair pink power cord anchored underneath her foot for moderate resistance 15x Yellow band D1/D2 PNF diagonals (anchored over top of the door) 10x each way Abduction from elevated table 60 and 70 degrees 12 reps each  UE Ranger on wall in cancer  gym 2/3 elevation:  flexion and abduction for left shoulder.  2x15 each  Cold pack post ex 3 min to decrease soreness 03/06/2024: UBE 3 min total forward/backward Seated green 3 way pball rollout x10 each direction Seated landmine press cane on mat table pink power  cord anchored underneath her foot for moderate resistance 15x Seated dowel overhead press 10x  Abduction from elevated table 60 and 70 degrees 5 reps each  Seated left biceps curl with 2 1/2 # cuff weight hold in palm x8  Green band lat pulls 10x Farmer's carry 3# 2 laps  Cones to low and middle shelf 2x5 1# to low and middle shelves 5x each UE Ranger on wall in cancer gym 2/3 elevation:  flexion and abduction for left shoulder.  2x10 each   03/01/2024: Seated UE Ranger warm up 30x Towel slides up gym railing 15x Seated landmine press press red band wrapped around trunk 15x Seated dowel overhead press 12x (challenging but OK) Standing left isometric red band with handle internal rotation 3 small side steps 8x Standing left isometric red band with handle external rotation  3 side small steps 8x  Standing shoulder rows with red tband x15 Standing shoulder extension with red tband x15 Seated green 3 way pball rollout x10 each direction UE Ranger in cancer gym:  flexion and abduction for left shoulder.  2x10 each  Seated left biceps curl with 2 1/2 cuff weight hold in palm x8 (challenging but OK)  02/28/2024: Seated pulleys flexion and scaption 2 min each UE Ranger in cancer gym:  flexion and abduction for left shoulder.  2x10 each  Quick Dash 68.18 Standing yellow physioball roll up the wall 2x10 Supine serratus punch with 1# 2x10 bilat Supine shoulder flexion with 1# dumbbell 2x10 bilat Right sidelying shoulder ER with 1# dumbbell 2x10 left Right sidelying shoulder abduction  with 1# dumbbell 2x10 left Seated left biceps curl with 2# dumbbell 2x8 Standing left isometric yellow band with handle internal rotation 3 small side steps 8x Standing left isometric yellow band with handle external rotation  3 side small steps 8x  Standing shoulder rows with yellow tband 2x10 Standing shoulder extension with yellow tband 2x10 Seated green 3 way pball rollout x10 each direction     PATIENT  EDUCATION: Education details: Educated patient on anatomy and physiology of current symptoms, prognosis, plan of care as well as initial self care strategies to promote recovery Person educated: Patient Education method: Explanation Education comprehension: verbalized understanding  HOME EXERCISE PROGRAM: Access Code: [email or web address] URL: [email or web address] Date: 02/28/2024 Prepared by: Chaneta Comer Menke  Exercises - Supine Shoulder Flexion Extension AAROM with Dowel  - 2 x daily - 7 x weekly - 1 sets - 10 reps - Supine Shoulder External Rotation AAROM with Dowel  - 2 x daily - 7 x weekly - 1 sets - 10 reps - Seated Single Arm Elbow Flexion with Dumbbell  - 2 x daily - 7 x weekly - 1 sets - 10 reps - Seated Scapular Retraction  - 2 x daily - 7 x weekly - 1 sets - 10 reps - Circular Shoulder Pendulum with Table Support  - 2 x daily - 7 x weekly - 1 sets - 10 reps - Single Arm Serratus Punches in Supine with Dumbbell  - 1 x daily - 7 x weekly - 2 sets - 10 reps - Supine Shoulder Flexion Extension Full Range AROM  - 1 x daily - 7 x weekly - 2 sets - 10  reps - Wall Clock  - 1 x daily - 7 x weekly - 1 sets - 5 reps - Brandt-Daroff Vestibular Exercise  - 1 x daily - 7 x weekly - 3-5 reps  ASSESSMENT:  CLINICAL IMPRESSION: Decreased volume of exercise secondary to increased shoulder discomfort after returning to work yesterday at [employer].  She was able to initiate diagonal UE patterns today with light resistance needed for work function.  Therapist closely monitoring response and modifying ex's as appropriate.  Improving with spontaneous use of her left UE.     OBJECTIVE IMPAIRMENTS: decreased activity tolerance, decreased ROM, decreased strength, impaired perceived functional ability, impaired UE functional use, and pain.   ACTIVITY LIMITATIONS: carrying, lifting, sleeping, reach over head, and hygiene/grooming  PARTICIPATION LIMITATIONS: meal prep, cleaning, laundry, and  occupation  PERSONAL FACTORS: Past/current experiences, 1 comorbidity: DN, history of neck pain/vertigo, and grief over the recent loss of her long time partner  are also affecting patient's functional outcome.   REHAB POTENTIAL: Good  CLINICAL DECISION MAKING: Stable/uncomplicated  EVALUATION COMPLEXITY: Low   GOALS: Goals reviewed with patient? Yes  SHORT TERM GOALS: Target date: 02/29/2024  The patient will demonstrate knowledge of basic self care strategies and exercises to promote healing  Baseline: Goal status: met 4/10  2.  The patient will have improved shoulder elevation ROM to at least 150 degrees needed for grooming/dressing purposes as well as reaching high shelves  Baseline:  Goal status: Ongoing  3.  The patient will have grossly 4-/5 strength needed to lift and lower a 2# object from a high shelf  Baseline:  Goal status: ongoing (see above)  4.  Patient will be able to carry a light to medium shopping bag in left hand Baseline:  Goal status: met 4/22    LONG TERM GOALS: Target date: 03/28/2024   The patient will be independent in a safe self progression of a home exercise program to promote further recovery of function  Baseline:  Goal status: INITIAL  2.  The patient will report a 75% improvement in pain levels with functional activities which are currently difficult including cooking, carrying things, grooming/dressing Baseline:  Goal status: INITIAL  3.  The patient will have improved shoulder elevation ROM to at least 155 degrees needed for grooming/dressing purposes as well as reaching high shelves  Baseline:  Goal status: INITIAL  4.  The patient will have grossly 4+/5 strength needed to lift and lower a 5# object from a high shelf  Baseline:  Goal status: INITIAL  5.  Quick DASH outcome score improved to   50%  indicating improved function with less pain Baseline:  Goal status: INITIAL   PLAN:  PT FREQUENCY: 2x/week  PT DURATION: 8  weeks  PLANNED INTERVENTIONS: 97164- PT Re-evaluation, 97110-Therapeutic exercises, 97530- Therapeutic activity, 97112- Neuromuscular re-education, 97535- Self Care, 52841- Manual therapy, [phone]- Aquatic Therapy, [id number]- Electrical stimulation (unattended), [phone]- Electrical stimulation (manual), 97035- Ultrasound, 40347- Ionotophoresis 4mg /ml Dexamethasone, Patient/Family education, Taping, Dry Needling, Joint mobilization, Cryotherapy, and Moist heat  PLAN FOR NEXT SESSION:  abduction from elevated table; farmer carries; lifts to/from shelves for functional strengthening; UE Ranger; pulleys; (healing left middle finger fracture);ball rolls floor or wall When healed, floor transfer practice  Darien Eden, PT 03/08/24 11:11 AM Phone: [phone] Fax: [phone]  [facility] [address] [place], [place] [address] Phone # [phone] Fax [phone]

## 2024-03-13 ENCOUNTER — Ambulatory Visit: Admitting: Physical Therapy

## 2024-03-15 ENCOUNTER — Ambulatory Visit: Attending: Orthopedic Surgery | Admitting: Physical Therapy

## 2024-03-15 DIAGNOSIS — M25512 Pain in left shoulder: Secondary | ICD-10-CM | POA: Diagnosis present

## 2024-03-15 DIAGNOSIS — M6281 Muscle weakness (generalized): Secondary | ICD-10-CM | POA: Diagnosis present

## 2024-03-15 DIAGNOSIS — M25612 Stiffness of left shoulder, not elsewhere classified: Secondary | ICD-10-CM | POA: Diagnosis present

## 2024-03-15 NOTE — Therapy (Signed)
 OUTPATIENT PHYSICAL THERAPY SHOULDER PROGRESS NOTE   Patient Name: Madison Tate MRN: [id number] DOB:1951/10/14, 73 y.o., female Today's Date: 03/15/2024    END OF SESSION:  PT End of Session - 03/15/24 1019     Visit Number 12    Date for PT Re-Evaluation 03/28/24    Authorization Type Medicare/Tricare    Progress Note Due on Visit 20    PT Start Time 1016    PT Stop Time 1058    PT Time Calculation (min) 42 min    Activity Tolerance Patient tolerated treatment well             Past Medical History:  Diagnosis Date   Anxiety    Diabetes mellitus without complication (HCC)    Hypercholesteremia    Hypertension    TIA (transient ischemic attack)    Past Surgical History:  Procedure Laterality Date   ABDOMINAL HYSTERECTOMY     There are no active problems to display for this patient.   PCP: none  REFERRING PROVIDER: Rober Chimera MD  REFERRING DIAG: left shoulder strain  THERAPY DIAG:  Left shoulder pain, left shoulder stiffness, weakness Rationale for Evaluation and Treatment: Rehabilitation  ONSET DATE: Jan 8  SUBJECTIVE:                                                                                                                                                                                      SUBJECTIVE STATEMENT: Work for 6 hours went OK but had a few rough spots.  Next shift on Saturday. Shoulder is still improving.  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 I can carry medium grocery bags now.   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 Yes NPRS scale: 2/10 Pain location: upper arm Pain orientation: Left  PAIN TYPE: aching Pain description: intermittent  Aggravating factors: overhead  elevation; lying on it left side, putting on a jacket Relieving factors: heat, Motrin  as needed   PRECAUTIONS: work can't go unless 10 hrs/week and no lift 25# ([employer])   WEIGHT BEARING RESTRICTIONS: No  FALLS:  Has patient fallen in last 6 months? Yes. Number of falls 1  LIVING ENVIRONMENT: Lives in: House/apartment   OCCUPATION: [employer] has to lift 50# (out of work until 6/6)  PLOF: Independent  PATIENT GOALS:decrease pain and strength back  NEXT MD VISIT: Dr. Hildy Lowers as needed  OBJECTIVE:  Note: Objective measures were completed at Evaluation unless otherwise noted.  DIAGNOSTIC FINDINGS:  11/23/23: CLINICAL DATA:  Walking her puppy any pulled her down, landing on left arm. Left arm in a sling. EXAM: LEFT SHOULDER COMPARISON:  None Available.   FINDINGS: Calcific densities or osseous fragments project inferior to the coracoid process on AP view and medial to the scapula on Y-view. These are indeterminate and may be chronic however acute fracture is difficult to exclude. Consider CT for further evaluation. 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   IMPRESSION: Calcific densities or osseous fragments project inferior to the coracoid process on AP view and medial to the scapula on Y-view. These are indeterminate and may be chronic however acute fracture is difficult to exclude. Consider CT for further evaluation. 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 CLINICAL DATA:  Recent fall with left arm pain, initial encounter   EXAM: LEFT HUMERUS - 2+ VIEW   COMPARISON:  None Available.   FINDINGS: Degenerative changes of the acromioclavicular joint are seen. No acute fracture or dislocation is noted. Mild olecranon spurring is noted.   IMPRESSION: No acute abnormality noted.  PATIENT SURVEYS:  Eval:  Quick Dash 63.6% 02/28/2024:  Quick Dash  68.18%  COGNITION: Overall cognitive status: Within functional limits for tasks assessed     UPPER EXTREMITY ROM:    Active ROM Right eval Left eval 4/1 left 4/10 4/22 5/1  Shoulder flexion 160 140 Active assisted 150 degrees 140 "comfortable" actively; active assisted 150 132 active 145  Shoulder extension        Shoulder abduction 162 77 painful   108 95 painful    Shoulder adduction        Shoulder internal rotation T10 L1  L5 hurts a little  Thumb to L5   Shoulder external rotation 72 50 painful  56 57   Elbow flexion        Elbow extension        Wrist flexion        Wrist extension        Wrist ulnar deviation        Wrist radial deviation        Wrist pronation        Wrist supination        (Blank rows = not tested)  UPPER EXTREMITY MMT:  MMT Right eval Left eval Left 02/28/24  Shoulder flexion 5 3 4   Shoulder extension 5 4 4   Shoulder abduction 5 2+ 3  Shoulder adduction     Shoulder internal rotation 5 3 4-  Shoulder external rotation 5 3 3+  Middle trapezius     Lower trapezius     Elbow flexion 5 4   Elbow extension 5 4   Wrist flexion     Wrist extension     Wrist ulnar deviation     Wrist radial deviation     Wrist pronation     Wrist supination     Grip strength (lbs)   25 lbs  (Blank rows = not tested)  TREATMENT DATE: 03/08/2024: UBE 5 min total forward/backward 80% of the work with right (unaffected side) Seated green 3 way pball rollout x10 each direction Green ball roll up the wall 10x  Seated landmine press cane on chair red power cord anchored underneath her foot for moderate resistance 15x Yellow band D1/D2 PNF diagonals (anchored over top of the door) 20x each way Abduction from elevated table 70 degrees with 1# 10 reps  Seated 1# overhead press 10x  Standing cable pull 5# 2 sets of 5 Standing towel on wall with 2# weight 10x Standing towel horizontal abduction 5x UE Ranger on wall in cancer gym 2/3 elevation:  flexion and abduction for left shoulder.  2x15 each   03/06/2024: UBE 3 min total forward/backward Seated green 3 way pball rollout x10  each direction Seated landmine press cane on mat table pink power cord anchored underneath her foot for moderate resistance 15x Seated dowel overhead press 10x  Abduction from elevated table 60 and 70 degrees 5 reps each  Seated left biceps curl with 2 1/2 # cuff weight hold in palm x8  Green band lat pulls 10x Farmer's carry 3# 2 laps  Cones to low and middle shelf 2x5 1# to low and middle shelves 5x each UE Ranger on wall in cancer gym 2/3 elevation:  flexion and abduction for left shoulder.  2x10 each   03/01/2024: Seated UE Ranger warm up 30x Towel slides up gym railing 15x Seated landmine press press red band wrapped around trunk 15x Seated dowel overhead press 12x (challenging but OK) Standing left isometric red band with handle internal rotation 3 small side steps 8x Standing left isometric red band with handle external rotation  3 side small steps 8x  Standing shoulder rows with red tband x15 Standing shoulder extension with red tband x15 Seated green 3 way pball rollout x10 each direction UE Ranger in cancer gym:  flexion and abduction for left shoulder.  2x10 each  Seated left biceps curl with 2 1/2 cuff weight hold in palm x8 (challenging but OK)     PATIENT EDUCATION: Education details: Educated patient on anatomy and physiology of current symptoms, prognosis, plan of care as well as initial self care strategies to promote recovery Person educated: Patient Education method: Explanation Education comprehension: verbalized understanding  HOME EXERCISE PROGRAM: Access Code: [email or web address] URL: [email or web address] Date: 02/28/2024 Prepared by: Chaneta Comer Menke  Exercises - Supine Shoulder Flexion Extension AAROM with Dowel  - 2 x daily - 7 x weekly - 1 sets - 10 reps - Supine Shoulder External Rotation AAROM with Dowel  - 2 x daily - 7 x weekly - 1 sets - 10 reps - Seated Single Arm Elbow Flexion with Dumbbell  - 2 x daily - 7 x weekly - 1 sets - 10 reps -  Seated Scapular Retraction  - 2 x daily - 7 x weekly - 1 sets - 10 reps - Circular Shoulder Pendulum with Table Support  - 2 x daily - 7 x weekly - 1 sets - 10 reps - Single Arm Serratus Punches in Supine with Dumbbell  - 1 x daily - 7 x weekly - 2 sets - 10 reps - Supine Shoulder Flexion Extension Full Range AROM  - 1 x daily - 7 x weekly - 2 sets - 10 reps - Wall Clock  - 1 x daily - 7 x weekly - 1 sets - 5 reps - Brandt-Daroff Vestibular Exercise  - 1 x daily - 7 x weekly - 3-5 reps  ASSESSMENT:  CLINICAL IMPRESSION: Steady improvements in shoulder ROM including elevation (both flexion and abduction).  Functional improvements reported with carrying shopping bags and she has completed 2 work shifts.  Pain intensity continues to decrease.  Therapist providing verbal cues to optimize technique with  exercises in order to achieve the greatest benefit. On track to meet rehab goals.     OBJECTIVE IMPAIRMENTS: decreased activity tolerance, decreased ROM,  decreased strength, impaired perceived functional ability, impaired UE functional use, and pain.   ACTIVITY LIMITATIONS: carrying, lifting, sleeping, reach over head, and hygiene/grooming  PARTICIPATION LIMITATIONS: meal prep, cleaning, laundry, and occupation  PERSONAL FACTORS: Past/current experiences, 1 comorbidity: DN, history of neck pain/vertigo, and grief over the recent loss of her long time partner  are also affecting patient's functional outcome.   REHAB POTENTIAL: Good  CLINICAL DECISION MAKING: Stable/uncomplicated  EVALUATION COMPLEXITY: Low   GOALS: Goals reviewed with patient? Yes  SHORT TERM GOALS: Target date: 02/29/2024  The patient will demonstrate knowledge of basic self care strategies and exercises to promote healing  Baseline: Goal status: met 4/10  2.  The patient will have improved shoulder elevation ROM to at least 150 degrees needed for grooming/dressing purposes as well as reaching high shelves   Baseline:  Goal status: Ongoing  3.  The patient will have grossly 4-/5 strength needed to lift and lower a 2# object from a high shelf  Baseline:  Goal status: met 5/1  4.  Patient will be able to carry a light to medium shopping bag in left hand Baseline:  Goal status: met 4/22    LONG TERM GOALS: Target date: 03/28/2024   The patient will be independent in a safe self progression of a home exercise program to promote further recovery of function  Baseline:  Goal status: INITIAL  2.  The patient will report a 75% improvement in pain levels with functional activities which are currently difficult including cooking, carrying things, grooming/dressing Baseline:  Goal status: INITIAL  3.  The patient will have improved shoulder elevation ROM to at least 155 degrees needed for grooming/dressing purposes as well as reaching high shelves  Baseline:  Goal status: INITIAL  4.  The patient will have grossly 4+/5 strength needed to lift and lower a 5# object from a high shelf  Baseline:  Goal status: INITIAL  5.  Quick DASH outcome score improved to   50%  indicating improved function with less pain Baseline:  Goal status: INITIAL   PLAN:  PT FREQUENCY: 2x/week  PT DURATION: 8 weeks  PLANNED INTERVENTIONS: 97164- PT Re-evaluation, 97110-Therapeutic exercises, 97530- Therapeutic activity, 97112- Neuromuscular re-education, 97535- Self Care, 95621- Manual therapy, [phone]- Aquatic Therapy, [id number]- Electrical stimulation (unattended), [phone]- Electrical stimulation (manual), [id number]- Ultrasound, 84132- Ionotophoresis 4mg /ml Dexamethasone, Patient/Family education, Taping, Dry Needling, Joint mobilization, Cryotherapy, and Moist heat  PLAN FOR NEXT SESSION:  abduction from elevated table; farmer carries; lifts to/from shelves for functional strengthening; UE Ranger; pulleys; (healing left middle finger fracture);ball rolls floor or wall When healed, floor transfer practice  Darien Eden, PT 03/15/24 6:16 PM Phone: [phone] Fax: [phone]  [facility] [address] [place], [place] [address] Phone # [phone] Fax [phone]

## 2024-03-20 ENCOUNTER — Ambulatory Visit: Admitting: Physical Therapy

## 2024-03-20 DIAGNOSIS — M25512 Pain in left shoulder: Secondary | ICD-10-CM

## 2024-03-20 DIAGNOSIS — M25612 Stiffness of left shoulder, not elsewhere classified: Secondary | ICD-10-CM

## 2024-03-20 DIAGNOSIS — M6281 Muscle weakness (generalized): Secondary | ICD-10-CM

## 2024-03-20 NOTE — Therapy (Signed)
 OUTPATIENT PHYSICAL THERAPY SHOULDER PROGRESS NOTE   Patient Name: Madison Tate MRN: [id number] DOB:30-May-1951, 73 y.o., female Today's Date: 03/20/2024    END OF SESSION:  PT End of Session - 03/20/24 1001     Visit Number 13    Date for PT Re-Evaluation 03/28/24    Authorization Type Medicare/Tricare    Progress Note Due on Visit 20    PT Start Time 1005    PT Stop Time 1045    PT Time Calculation (min) 40 min    Activity Tolerance Patient tolerated treatment well             Past Medical History:  Diagnosis Date   Anxiety    Diabetes mellitus without complication (HCC)    Hypercholesteremia    Hypertension    TIA (transient ischemic attack)    Past Surgical History:  Procedure Laterality Date   ABDOMINAL HYSTERECTOMY     There are no active problems to display for this patient.   PCP: none  REFERRING PROVIDER: Rober Chimera MD  REFERRING DIAG: left shoulder strain  THERAPY DIAG:  Left shoulder pain, left shoulder stiffness, weakness Rationale for Evaluation and Treatment: Rehabilitation  ONSET DATE: Jan 8  SUBJECTIVE:                                                                                                                                                                                      SUBJECTIVE STATEMENT: I did ok at work on Saturday.  Just trouble with heavier things overhead.  A couple of ouch times. All in all it was OK.    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 Yes NPRS scale: 2/10 Pain location: upper arm Pain orientation: Left  PAIN TYPE: aching Pain description: intermittent  Aggravating factors: overhead elevation; lying on it left side, putting on a jacket Relieving factors:  heat, Motrin  as needed   PRECAUTIONS: work can't go unless 10 hrs/week and no lift 25# ([employer])   WEIGHT BEARING RESTRICTIONS: No  FALLS:  Has patient fallen in last 6 months? Yes. Number of falls 1  LIVING ENVIRONMENT: Lives in: House/apartment   OCCUPATION: [employer] has to lift 50# (out of work until 6/6)  PLOF: Independent  PATIENT GOALS:decrease pain and strength back  NEXT MD VISIT: Dr. Hildy Lowers as needed  OBJECTIVE:  Note: Objective measures were completed at Evaluation unless otherwise noted.  DIAGNOSTIC FINDINGS:  11/23/23: CLINICAL DATA:  Walking her puppy any pulled her down, landing on left arm. Left arm in a sling. EXAM: LEFT SHOULDER COMPARISON:  None Available.   FINDINGS: Calcific densities or osseous fragments project inferior to the coracoid process on AP view and medial to the scapula on Y-view. These are indeterminate and may be chronic however acute fracture is difficult to exclude. Consider CT for further evaluation. 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   IMPRESSION: Calcific densities or osseous fragments project inferior to the coracoid process on AP view and medial to the scapula on Y-view. These are indeterminate and may be chronic however acute fracture is difficult to exclude. Consider CT for further evaluation. 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 CLINICAL DATA:  Recent fall with left arm pain, initial encounter   EXAM: LEFT HUMERUS - 2+ VIEW   COMPARISON:  None Available.   FINDINGS: Degenerative changes of the acromioclavicular joint are seen. No acute fracture or dislocation is noted. Mild olecranon spurring is noted.   IMPRESSION: No acute abnormality noted.  PATIENT SURVEYS:  Eval:  Quick Dash 63.6% 02/28/2024:  Quick Dash  68.18%  COGNITION: Overall cognitive status: Within functional limits for tasks assessed     UPPER EXTREMITY ROM:   Active ROM Right eval Left eval 4/1 left 4/10 4/22 5/1 5/6   Shoulder flexion 160 140 Active assisted 150 degrees 140 "comfortable" actively; active assisted 150 132 active 145 150  Shoulder extension         Shoulder abduction 162 77 painful   108 95 painful     Shoulder adduction         Shoulder internal rotation T10 L1  L5 hurts a little  Thumb to L5    Shoulder external rotation 72 50 painful  56 57    Elbow flexion         Elbow extension         Wrist flexion         Wrist extension         Wrist ulnar deviation         Wrist radial deviation         Wrist pronation         Wrist supination         (Blank rows = not tested)  UPPER EXTREMITY MMT:  MMT Right eval Left eval Left 02/28/24  Shoulder flexion 5 3 4   Shoulder extension 5 4 4   Shoulder abduction 5 2+ 3  Shoulder adduction     Shoulder internal rotation 5 3 4-  Shoulder external rotation 5 3 3+  Middle trapezius     Lower trapezius     Elbow flexion 5 4   Elbow extension 5 4   Wrist flexion     Wrist extension     Wrist ulnar deviation     Wrist radial deviation     Wrist pronation     Wrist supination     Grip strength (lbs)   25 lbs  (Blank rows = not tested)  TREATMENT DATE: 03/20/2024: UBE 6 min total forward/backward while discuss status  Seated green 3 way pball rollout x10 each direction Green ball roll up the wall 10x  Seated landmine press cane on chair green band anchored underneath her foot for moderate resistance 30x Seated shoulder flexion 1# 10x Diagonal flexion 1# 5x (challenging, initially painful but improved) Lat bar 30# 10x standing  red band D1/D2 PNF diagonals (anchored over top of the  door) 10x each way Abduction from elevated table 80 degrees with 1# 10 reps Seated bil 1# overhead press 10x  Standing cable pull 5# 15x UE Ranger on wall in cancer gym 2/3 elevation:  flexion and abduction for left shoulder.  2x15 each   03/08/2024: UBE 5 min total forward/backward 80% of the work with right (unaffected side) Seated green 3 way pball  rollout x10 each direction Green ball roll up the wall 10x  Seated landmine press cane on chair red power cord anchored underneath her foot for moderate resistance 15x Yellow band D1/D2 PNF diagonals (anchored over top of the door) 20x each way Abduction from elevated table 70 degrees with 1# 10 reps Seated 1# overhead press 10x  Standing cable pull 5# 2 sets of 5 Standing towel on wall with 2# weight 10x Standing towel horizontal abduction 5x UE Ranger on wall in cancer gym 2/3 elevation:  flexion and abduction for left shoulder.  2x15 each   03/06/2024: UBE 3 min total forward/backward Seated green 3 way pball rollout x10 each direction Seated landmine press cane on mat table pink power cord anchored underneath her foot for moderate resistance 15x Seated dowel overhead press 10x  Abduction from elevated table 60 and 70 degrees 5 reps each  Seated left biceps curl with 2 1/2 # cuff weight hold in palm x8  Green band lat pulls 10x Farmer's carry 3# 2 laps  Cones to low and middle shelf 2x5 1# to low and middle shelves 5x each UE Ranger on wall in cancer gym 2/3 elevation:  flexion and abduction for left shoulder.  2x10 each   PATIENT EDUCATION: Education details: Educated patient on anatomy and physiology of current symptoms, prognosis, plan of care as well as initial self care strategies to promote recovery Person educated: Patient Education method: Explanation Education comprehension: verbalized understanding  HOME EXERCISE PROGRAM: Access Code: [email or web address] URL: [email or web address] Date: 02/28/2024 Prepared by: Chaneta Comer Menke  Exercises - Supine Shoulder Flexion Extension AAROM with Dowel  - 2 x daily - 7 x weekly - 1 sets - 10 reps - Supine Shoulder External Rotation AAROM with Dowel  - 2 x daily - 7 x weekly - 1 sets - 10 reps - Seated Single Arm Elbow Flexion with Dumbbell  - 2 x daily - 7 x weekly - 1 sets - 10 reps - Seated Scapular Retraction  - 2 x daily -  7 x weekly - 1 sets - 10 reps - Circular Shoulder Pendulum with Table Support  - 2 x daily - 7 x weekly - 1 sets - 10 reps - Single Arm Serratus Punches in Supine with Dumbbell  - 1 x daily - 7 x weekly - 2 sets - 10 reps - Supine Shoulder Flexion Extension Full Range AROM  - 1 x daily - 7 x weekly - 2 sets - 10 reps - Wall Clock  - 1 x daily - 7 x weekly - 1 sets - 5 reps - Brandt-Daroff Vestibular Exercise  - 1 x daily - 7 x weekly - 3-5 reps  ASSESSMENT:  CLINICAL IMPRESSION: Able to progress intensity of exercise to include more functional reaching in multiple planes with weight.  She has some mild discomfort with diagonal flexion but generally she reports mostly muscular fatigue.  Improving spontaneous UE use with function and arm swing with gait. Therapist monitoring response and providing verbal cues for exercise technique.     OBJECTIVE IMPAIRMENTS: decreased activity tolerance, decreased ROM, decreased strength, impaired  perceived functional ability, impaired UE functional use, and pain.   ACTIVITY LIMITATIONS: carrying, lifting, sleeping, reach over head, and hygiene/grooming  PARTICIPATION LIMITATIONS: meal prep, cleaning, laundry, and occupation  PERSONAL FACTORS: Past/current experiences, 1 comorbidity: DN, history of neck pain/vertigo, and grief over the recent loss of her long time partner  are also affecting patient's functional outcome.   REHAB POTENTIAL: Good  CLINICAL DECISION MAKING: Stable/uncomplicated  EVALUATION COMPLEXITY: Low   GOALS: Goals reviewed with patient? Yes  SHORT TERM GOALS: Target date: 02/29/2024  The patient will demonstrate knowledge of basic self care strategies and exercises to promote healing  Baseline: Goal status: met 4/10  2.  The patient will have improved shoulder elevation ROM to at least 150 degrees needed for grooming/dressing purposes as well as reaching high shelves  Baseline:  Goal status: met 5/6  3.  The patient will  have grossly 4-/5 strength needed to lift and lower a 2# object from a high shelf  Baseline:  Goal status: met 5/1  4.  Patient will be able to carry a light to medium shopping bag in left hand Baseline:  Goal status: met 4/22    LONG TERM GOALS: Target date: 03/28/2024   The patient will be independent in a safe self progression of a home exercise program to promote further recovery of function  Baseline:  Goal status: INITIAL  2.  The patient will report a 75% improvement in pain levels with functional activities which are currently difficult including cooking, carrying things, grooming/dressing Baseline:  Goal status: INITIAL  3.  The patient will have improved shoulder elevation ROM to at least 155 degrees needed for grooming/dressing purposes as well as reaching high shelves  Baseline:  Goal status: INITIAL  4.  The patient will have grossly 4+/5 strength needed to lift and lower a 5# object from a high shelf  Baseline:  Goal status: INITIAL  5.  Quick DASH outcome score improved to   50%  indicating improved function with less pain Baseline:  Goal status: INITIAL   PLAN:  PT FREQUENCY: 2x/week  PT DURATION: 8 weeks  PLANNED INTERVENTIONS: 97164- PT Re-evaluation, 97110-Therapeutic exercises, 97530- Therapeutic activity, 97112- Neuromuscular re-education, 97535- Self Care, 16109- Manual therapy, [phone]- Aquatic Therapy, U9811- Electrical stimulation (unattended), [phone]- Electrical stimulation (manual), [id number]- Ultrasound, 29562- Ionotophoresis 4mg /ml Dexamethasone, Patient/Family education, Taping, Dry Needling, Joint mobilization, Cryotherapy, and Moist heat  PLAN FOR NEXT SESSION:  abduction from elevated table; farmer carries; lifts to/from shelves for functional strengthening; UE Ranger; pulleys; (healing left middle finger fracture);ball rolls floor or wall When healed, floor transfer practice   Darien Eden, PT 03/20/24 10:54 AM Phone: [phone] Fax:  [phone]  [facility] [address] [place], [place] [address] Phone # [phone] Fax [phone]

## 2024-03-21 ENCOUNTER — Ambulatory Visit: Payer: Self-pay | Admitting: Surgery

## 2024-03-21 NOTE — H&P (Signed)
 Madison Tate [id number]   Referring Provider:  Self   Subjective   Chief Complaint: Follow-up     History of Present Illness: Returns to discuss surgery.  She did undergo CT scan as we had discussed which redemonstrates gallstones but no evidence of cholecystitis and no other acute intra-abdominal or pelvic findings, no evidence of obstruction mass lesions or adenopathy and I discussed this by phone with her on 1/24.  At that time she had had no further abdominal pain since stopping her Ozempic and we reviewed options including continued abstinence from Ozempic, bowel regimen, and proceeding with cholecystectomy.  Given her symptoms at that time as well as ongoing significant grief issues that she was going through, she decided to continue observation at that time.  Plan was to follow-up in around 6 months (around July), sooner if needed.  She has remained off Ozempic (switched to Trulicity but has not actually started taking this), but unfortunately has had some intermittent recurrence of pain.  It remains more lower abdomen than upper and can be pretty miserable.  She is interested in proceeding with cholecystectomy. Of note, last time she had general anesthesia at [facility], she notes that she has a very small airway and "the wrong sized tube" was used which created a "pocket" which led to some issues swallowing for a while.  11/24/23:Very pleasant 72 year old woman with history of anxiety, diabetes, hypertension, hyperlipidemia, TIA and previous abdominal surgery of hysterectomy who presents for evaluation of symptomatic cholelithiasis.  Her medication list does include Ozempic.  She had an ultrasound done at [facility] in April 2024 which shows mildly enlarged liver, echo shadow phenomenon with cholelithiasis completely filling the lumen of the gallbladder with a wall thickness of 2.8 mm and a negative Murphy sign.  Common bile duct 5 mm.  She notes intermittent right sided abdominal pain once or twice a  week.  This is actually slightly inferior to the level of the umbilicus.  Does not radiate.  Not related to eating or any type of food.  Occasional morning nausea but no emesis.  Has noticed increasing constipation, but has not correlated bowel movements or constipation to when the pain comes on.  She did note that she skipped Ozempic for a couple weeks and the pain went away.  She is due for repeat Cologuard this month.  She has what sounds like a fairly extensive gynecological surgical history and has been told on multiple occasions that she has severe adhesive disease.  She states that during her most recent colonoscopy there was quite a lot of difficulty navigating the colon due to anatomy secondary to adhesions.  She has had quite a difficult year.  She lost her childhood friend, followed by her next-door neighbor who was a very dear friend, followed by her significant other in the span of a few months.  She actually had a fall yesterday.  Plan- While she does have gallstones and increased probability of biliary colic secondary to her Ozempic use, her symptoms are not typical and in fact her pain is more lower abdomen.  Given history of adhesive disease we will proceed with CT abdomen and pelvis to evaluate for any partial obstruction, mass or other etiology of her pain.  Review of Systems: A complete review of systems was obtained from the patient.  I have reviewed this information and discussed as appropriate with the patient.  See HPI as well for other ROS.   Medical History: Past Medical History:  Diagnosis Date  Arthritis    Diabetes mellitus type 2, uncomplicated (CMS/HHS-HCC)    Hyperlipidemia    Hypertension    Sleep apnea    Tremor     There is no problem list on file for this patient.   Past Surgical History:  Procedure Laterality Date   TUBAL LIGATION  03-30-1983   CESAREAN SECTION     DILATION AND CURETTAGE, DIAGNOSTIC / THERAPEUTIC     HYSTERECTOMY VAGINAL        Allergies  Allergen Reactions   Primidone Anaphylaxis   Asparagus Hives   Metoprolol Other (See Comments)   Sulfa (Sulfonamide Antibiotics) Other (See Comments)    Bactrim   Atenolol Swelling    Reaction(s): Unknown   Meperidine Other (See Comments)    Reaction(s): Unknown; Note: severe vomiting   Propoxyphene Other (See Comments)    Reaction(s): Unknown; Note: 7=7=99    rmt  [id number]   Sulfamethoxazole-Trimethoprim Rash    Reaction(s): Unknown; Note: [id number] SKD 252 [id number];ZHYQ and severe vomiting   Trokendi Xr [Topiramate] Unknown    Current Outpatient Medications on File Prior to Visit  Medication Sig Dispense Refill   amLODIPine (NORVASC) 5 MG tablet Take by mouth.     aspirin 325 MG tablet Take by mouth.     dulaglutide (TRULICITY) 1.5 mg/0.5 mL subcutaneous pen injector Inject 1.5 mg subcutaneously every 7 (seven) days     ezetimibe (ZETIA) 10 mg tablet Take 10 mg by mouth once daily     multivitamin tablet Take 1 tablet by mouth once daily.     propranoloL (INDERAL LA) 80 MG 24 hr capsule Take 2 capsules (160 mg total) by mouth once daily (Patient taking differently: Take 80 mg by mouth) 90 capsule 3   gabapentin (NEURONTIN) 100 MG capsule Take 1 capsule (100 mg total) by mouth 3 (three) times daily 90 capsule 11   No current facility-administered medications on file prior to visit.    Family History  Problem Relation Age of Onset   Coronary Artery Disease (Blocked arteries around heart) Mother    Hyperlipidemia (Elevated cholesterol) Mother    High blood pressure (Hypertension) Mother    Migraines Mother    Seizures Mother    Stroke Mother    Alcohol abuse Mother    Alzheimer's disease Mother    Glaucoma Mother    Coronary Artery Disease (Blocked arteries around heart) Father    Myocardial Infarction (Heart attack) Father    High blood pressure (Hypertension) Father    Multiple sclerosis Maternal Grandmother      Social History   Tobacco Use  Smoking  Status Former   Current packs/day: 0.00   Average packs/day: 2.0 packs/day for 10.0 years (20.0 ttl pk-yrs)   Types: Cigarettes   Start date: 08/18/1979   Quit date: 08/17/1989   Years since quitting: 34.6  Smokeless Tobacco Never     Social History   Socioeconomic History   Marital status: Widowed  Tobacco Use   Smoking status: Former    Current packs/day: 0.00    Average packs/day: 2.0 packs/day for 10.0 years (20.0 ttl pk-yrs)    Types: Cigarettes    Start date: 08/18/1979    Quit date: 08/17/1989    Years since quitting: 34.6   Smokeless tobacco: Never  Vaping Use   Vaping status: Never Used  Substance and Sexual Activity   Alcohol use: Yes    Alcohol/week: 0.0 standard drinks of alcohol    Comment: On occassions   Drug use: No  Sexual activity: Not Currently    Partners: Male    Birth control/protection: Surgical, None  Other Topics Concern   Would you please tell us  about the people who live in your home, your pets, or anything else important to your social life? No   Social Drivers of Corporate investment banker Strain: Medium Risk (03/14/2024)   Received from [facility]   Overall Financial Resource Strain (CARDIA)    Difficulty of Paying Living Expenses: Somewhat hard  Food Insecurity: No Food Insecurity (03/14/2024)   Received from [facility]   Hunger Vital Sign    Worried About Running Out of Food in the Last Year: Never true    Ran Out of Food in the Last Year: Never true  Transportation Needs: No Transportation Needs (03/14/2024)   Received from [facility] - Transportation    Lack of Transportation (Medical): No    Lack of Transportation (Non-Medical): No  Physical Activity: Sufficiently Active (03/14/2024)   Received from [facility]   Exercise Vital Sign    Days of Exercise per Week: 3 days    Minutes of Exercise per Session: 100 min  Stress: No Stress Concern Present (03/14/2024)   Received from [facility]  [facility] Occupational [facility] - Occupational Stress Questionnaire    Feeling of Stress : Only a little  Social Connections: Moderately Integrated (03/14/2024)   Received [facility]   Social Network    How would you rate your social network (family, work, friends)?: Adequate participation with social networks  Housing Stability: Low Risk  (03/14/2024)   Received from [facility] Stability Vital Sign    Unable to Pay for Housing in the Last Year: No    Number of Times Moved in the Last Year: 0    Homeless in the Last Year: No    Objective:    Vitals:   03/21/24 1427  PainSc: 0-No pain     There is no height or weight on file to calculate BMI.  Gen: A&Ox3, no distress  Unlabored respirations Abdomen is soft, nondistended, mildly tender in the right lower quadrant, about one third the distance from the umbilicus to the ASIS without palpable mass.  No right upper quadrant tenderness or pain.  Assessment and Plan:  Diagnoses and all orders for this visit:  Right lower quadrant abdominal pain  Calculus of gallbladder without cholecystitis without obstruction   Has had some intermittent pain off Ozempic.  I recommend proceeding with laparoscopic cholecystectomy with possible cholangiogram and discussed also diagnostic laparoscopy to look in the right lower quadrant, despite negative CT still possible she has some adhesive disease contributing to pain.  We discussed that if this was an extensive process, would not recommend aggressive intervention due to risk of bowel injury however if there is a simple band adhesion or obvious process that can be addressed intraoperatively we will do that.  Discussed the relevant anatomy using a diagram to demonstrate, and went over surgical technique.  Discussed risks of surgery including bleeding, infection, pain, scarring, intraabdominal injury specifically to the common bile duct and sequelae, subtotal cholecystectomy, bile leak, conversion  to open surgery, failure to resolve symptoms, post-cholecystectomy diarrhea which is typically self-limited, blood clots/ pulmonary embolus, heart attack, pneumonia, stroke, death. Questions were welcomed and answered to patient's satisfaction.  Patient wishes to proceed with surgery.    Sultana Tierney Claudetta Cuba, MD

## 2024-03-22 ENCOUNTER — Ambulatory Visit: Payer: Self-pay | Admitting: Physical Therapy

## 2024-03-22 DIAGNOSIS — M25612 Stiffness of left shoulder, not elsewhere classified: Secondary | ICD-10-CM

## 2024-03-22 DIAGNOSIS — M25512 Pain in left shoulder: Secondary | ICD-10-CM | POA: Diagnosis not present

## 2024-03-22 DIAGNOSIS — M6281 Muscle weakness (generalized): Secondary | ICD-10-CM

## 2024-03-22 NOTE — Therapy (Signed)
 OUTPATIENT PHYSICAL THERAPY SHOULDER PROGRESS NOTE   Patient Name: Madison Tate MRN: [id number] DOB:April 22, 1951, 73 y.o., female Today's Date: 03/22/2024    END OF SESSION:  PT End of Session - 03/22/24 0926     Visit Number 14    Date for PT Re-Evaluation 03/28/24    Authorization Type Medicare/Tricare    Progress Note Due on Visit 20    PT Start Time 0930    PT Stop Time 1010    PT Time Calculation (min) 40 min    Activity Tolerance Patient tolerated treatment well             Past Medical History:  Diagnosis Date   Anxiety    Diabetes mellitus without complication (HCC)    Hypercholesteremia    Hypertension    TIA (transient ischemic attack)    Past Surgical History:  Procedure Laterality Date   ABDOMINAL HYSTERECTOMY     There are no active problems to display for this patient.   PCP: none  REFERRING PROVIDER: Rober Chimera MD  REFERRING DIAG: left shoulder strain  THERAPY DIAG:  Left shoulder pain, left shoulder stiffness, weakness Rationale for Evaluation and Treatment: Rehabilitation  ONSET DATE: Jan 8  SUBJECTIVE:                                                                                                                                                                                      SUBJECTIVE STATEMENT: Gallbladder surgery in June.  Goes to [facility] later today.  My right shoulder (the unaffected shoulder) has been bothering me.  Maybe the dog pulled me.  The pain is just a 1 today but sometimes more with exercises.    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 Yes NPRS scale: 1/10 Pain location: left upper arm, some right shoulder too Pain orientation: Left  PAIN TYPE: aching Pain  description: intermittent  Aggravating factors: overhead elevation; lying on it left side, putting on a jacket Relieving factors: heat, Motrin  as needed   PRECAUTIONS: work can't go unless 10 hrs/week and no lift 25# ([employer])   WEIGHT BEARING RESTRICTIONS: No  FALLS:  Has patient fallen in last 6 months? Yes. Number of falls 1  LIVING ENVIRONMENT: Lives in: House/apartment   OCCUPATION: [employer] has to lift 50# (out of work until 6/6)  PLOF: Independent  PATIENT GOALS:decrease pain and strength back  NEXT MD VISIT: Dr.  Hildy Lowers as needed  OBJECTIVE:  Note: Objective measures were completed at Evaluation unless otherwise noted.  DIAGNOSTIC FINDINGS:  11/23/23: CLINICAL DATA:  Walking her puppy any pulled her down, landing on left arm. Left arm in a sling. EXAM: LEFT SHOULDER COMPARISON:  None Available.   FINDINGS: Calcific densities or osseous fragments project inferior to the coracoid process on AP view and medial to the scapula on Y-view. These are indeterminate and may be chronic however acute fracture is difficult to exclude. Consider CT for further evaluation. 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   IMPRESSION: Calcific densities or osseous fragments project inferior to the coracoid process on AP view and medial to the scapula on Y-view. These are indeterminate and may be chronic however acute fracture is difficult to exclude. Consider CT for further evaluation. 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 CLINICAL DATA:  Recent fall with left arm pain, initial encounter   EXAM: LEFT HUMERUS - 2+ VIEW   COMPARISON:  None Available.   FINDINGS: Degenerative changes of the acromioclavicular joint are seen. No acute fracture or dislocation is noted. Mild olecranon spurring is noted.   IMPRESSION: No acute abnormality noted.  PATIENT SURVEYS:  Eval:  Quick Dash 63.6% 02/28/2024:  Quick Dash  68.18%  COGNITION: Overall cognitive status: Within  functional limits for tasks assessed     UPPER EXTREMITY ROM:   Active ROM Right eval Left eval 4/1 left 4/10 4/22 5/1 5/6 5/8  Shoulder flexion 160 140 Active assisted 150 degrees 140 "comfortable" actively; active assisted 150 132 active 145 150   Shoulder extension          Shoulder abduction 162 77 painful   108 95 painful    145   Shoulder adduction          Shoulder internal rotation T10 L1  L5 hurts a little  Thumb to L5     Shoulder external rotation 72 50 painful  56 57     Elbow flexion          Elbow extension          Wrist flexion          Wrist extension          Wrist ulnar deviation          Wrist radial deviation          Wrist pronation          Wrist supination          (Blank rows = not tested)  UPPER EXTREMITY MMT:  MMT Right eval Left eval Left 02/28/24  Shoulder flexion 5 3 4   Shoulder extension 5 4 4   Shoulder abduction 5 2+ 3  Shoulder adduction     Shoulder internal rotation 5 3 4-  Shoulder external rotation 5 3 3+  Middle trapezius     Lower trapezius     Elbow flexion 5 4   Elbow extension 5 4   Wrist flexion     Wrist extension     Wrist ulnar deviation     Wrist radial deviation     Wrist pronation     Wrist supination     Grip strength (lbs)   25 lbs  (Blank rows = not tested)  TREATMENT DATE: 03/22/2024: UBE 6 min total forward/backward while discuss status  Wall push up straight and angled 5x each  Discussion of gradual progression of UE weightbearing for return to floor transfers Seated lateral push ups left 5x  Seated green 3 way pball rollout x10 each direction Seated: 1# bil 3 ways 10x each flexion, scaption, abduction  Green ball roll up the wall 10x  Lat bar 30# 15x standing  Abduction from elevated table 80 degrees with 1# 10 reps Shoulder press 1# 10x Seated bil 1# overhead press 10x  Standing cable pull 10# 15x 7# farmer's carry 2 laps around the gym  03/20/2024: UBE 6 min total forward/backward while discuss  status  Seated green 3 way pball rollout x10 each direction Green ball roll up the wall 10x  Seated landmine press cane on chair green band anchored underneath her foot for moderate resistance 30x Seated shoulder flexion 1# 10x Diagonal flexion 1# 5x (challenging, initially painful but improved) Lat bar 30# 10x standing  red band D1/D2 PNF diagonals (anchored over top of the door) 10x each way Abduction from elevated table 80 degrees with 1# 10 reps Seated bil 1# overhead press 10x  Standing cable pull 5# 15x UE Ranger on wall in cancer gym 2/3 elevation:  flexion and abduction for left shoulder.  2x15 each   03/08/2024: UBE 5 min total forward/backward 80% of the work with right (unaffected side) Seated green 3 way pball rollout x10 each direction Green ball roll up the wall 10x  Seated landmine press cane on chair red power cord anchored underneath her foot for moderate resistance 15x Yellow band D1/D2 PNF diagonals (anchored over top of the door) 20x each way Abduction from elevated table 70 degrees with 1# 10 reps Seated 1# overhead press 10x  Standing cable pull 5# 2 sets of 5 Standing towel on wall with 2# weight 10x Standing towel horizontal abduction 5x UE Ranger on wall in cancer gym 2/3 elevation:  flexion and abduction for left shoulder.  2x15 each   03/06/2024: UBE 3 min total forward/backward Seated green 3 way pball rollout x10 each direction Seated landmine press cane on mat table pink power cord anchored underneath her foot for moderate resistance 15x Seated dowel overhead press 10x  Abduction from elevated table 60 and 70 degrees 5 reps each  Seated left biceps curl with 2 1/2 # cuff weight hold in palm x8  Green band lat pulls 10x Farmer's carry 3# 2 laps  Cones to low and middle shelf 2x5 1# to low and middle shelves 5x each UE Ranger on wall in cancer gym 2/3 elevation:  flexion and abduction for left shoulder.  2x10 each   PATIENT EDUCATION: Education  details: Educated patient on anatomy and physiology of current symptoms, prognosis, plan of care as well as initial self care strategies to promote recovery Person educated: Patient Education method: Explanation Education comprehension: verbalized understanding  HOME EXERCISE PROGRAM: Access Code: [email or web address] URL: [email or web address] Date: 02/28/2024 Prepared by: Chaneta Comer Menke  Exercises - Supine Shoulder Flexion Extension AAROM with Dowel  - 2 x daily - 7 x weekly - 1 sets - 10 reps - Supine Shoulder External Rotation AAROM with Dowel  - 2 x daily - 7 x weekly - 1 sets - 10 reps - Seated Single Arm Elbow Flexion with Dumbbell  - 2 x daily - 7 x weekly - 1 sets - 10 reps - Seated Scapular Retraction  - 2 x daily - 7 x weekly - 1 sets - 10 reps - Circular Shoulder Pendulum with Table Support  - 2 x daily - 7 x weekly - 1 sets - 10 reps - Single Arm Serratus Punches in Supine with Dumbbell  - 1  x daily - 7 x weekly - 2 sets - 10 reps - Supine Shoulder Flexion Extension Full Range AROM  - 1 x daily - 7 x weekly - 2 sets - 10 reps - Wall Clock  - 1 x daily - 7 x weekly - 1 sets - 5 reps - Brandt-Daroff Vestibular Exercise  - 1 x daily - 7 x weekly - 3-5 reps  ASSESSMENT:  CLINICAL IMPRESSION: Lin's ROM continues to improve for functional reaching overhead.  Her strength is improving as well as needed for her work at [employer] but she avoids the heavier objects.  Pain level remains lower in intensity overall.  Will assess progress toward goals next week and anticipate readiness for discharge to an independent HEP next visit.    OBJECTIVE IMPAIRMENTS: decreased activity tolerance, decreased ROM, decreased strength, impaired perceived functional ability, impaired UE functional use, and pain.   ACTIVITY LIMITATIONS: carrying, lifting, sleeping, reach over head, and hygiene/grooming  PARTICIPATION LIMITATIONS: meal prep, cleaning, laundry, and occupation  PERSONAL FACTORS:  Past/current experiences, 1 comorbidity: DN, history of neck pain/vertigo, and grief over the recent loss of her long time partner are also affecting patient's functional outcome.   REHAB POTENTIAL: Good  CLINICAL DECISION MAKING: Stable/uncomplicated  EVALUATION COMPLEXITY: Low   GOALS: Goals reviewed with patient? Yes  SHORT TERM GOALS: Target date: 02/29/2024  The patient will demonstrate knowledge of basic self care strategies and exercises to promote healing  Baseline: Goal status: met 4/10  2.  The patient will have improved shoulder elevation ROM to at least 150 degrees needed for grooming/dressing purposes as well as reaching high shelves  Baseline:  Goal status: met 5/6  3.  The patient will have grossly 4-/5 strength needed to lift and lower a 2# object from a high shelf  Baseline:  Goal status: met 5/1  4.  Patient will be able to carry a light to medium shopping bag in left hand Baseline:  Goal status: met 4/22    LONG TERM GOALS: Target date: 03/28/2024   The patient will be independent in a safe self progression of a home exercise program to promote further recovery of function  Baseline:  Goal status: INITIAL  2.  The patient will report a 75% improvement in pain levels with functional activities which are currently difficult including cooking, carrying things, grooming/dressing Baseline:  Goal status: INITIAL  3.  The patient will have improved shoulder elevation ROM to at least 155 degrees needed for grooming/dressing purposes as well as reaching high shelves  Baseline:  Goal status: INITIAL  4.  The patient will have grossly 4+/5 strength needed to lift and lower a 5# object from a high shelf  Baseline:  Goal status: INITIAL  5.  Quick DASH outcome score improved to   50%  indicating improved function with less pain Baseline:  Goal status: INITIAL   PLAN:  PT FREQUENCY: 2x/week  PT DURATION: 8 weeks  PLANNED INTERVENTIONS: 97164- PT  Re-evaluation, 97110-Therapeutic exercises, 97530- Therapeutic activity, 97112- Neuromuscular re-education, 97535- Self Care, 21308- Manual therapy, [phone]- Aquatic Therapy, G0283- Electrical stimulation (unattended), [phone]- Electrical stimulation (manual), [id number]- Ultrasound, 52841- Ionotophoresis 4mg /ml Dexamethasone, Patient/Family education, Taping, Dry Needling, Joint mobilization, Cryotherapy, and Moist heat  PLAN FOR NEXT SESSION:  abduction from elevated table; farmer carries; lifts to/from shelves for functional strengthening; UE Ranger; pulleys; (healing left middle finger fracture);ball rolls floor or wall When healed, floor transfer practice  Darien Eden, PT 03/22/24 11:20 AM Phone: [phone] Fax: [phone]

## 2024-03-23 ENCOUNTER — Encounter: Admitting: Physical Therapy

## 2024-03-27 ENCOUNTER — Ambulatory Visit: Admitting: Physical Therapy

## 2024-03-27 DIAGNOSIS — M6281 Muscle weakness (generalized): Secondary | ICD-10-CM

## 2024-03-27 DIAGNOSIS — M25512 Pain in left shoulder: Secondary | ICD-10-CM

## 2024-03-27 DIAGNOSIS — M25612 Stiffness of left shoulder, not elsewhere classified: Secondary | ICD-10-CM

## 2024-03-27 NOTE — Therapy (Signed)
 OUTPATIENT PHYSICAL THERAPY SHOULDER PROGRESS NOTE/DISCHARGE SUMMARY   Patient Name: Madison Tate MRN: [id number] DOB:January 02, 1951, 73 y.o., female Today's Date: 03/27/2024    END OF SESSION:  PT End of Session - 03/27/24 1023     Visit Number 15    Date for PT Re-Evaluation 03/28/24    Authorization Type Medicare/Tricare    Progress Note Due on Visit 20    PT Start Time 1020    PT Stop Time 1059    PT Time Calculation (min) 39 min    Activity Tolerance Patient tolerated treatment well             Past Medical History:  Diagnosis Date   Anxiety    Diabetes mellitus without complication (HCC)    Hypercholesteremia    Hypertension    TIA (transient ischemic attack)    Past Surgical History:  Procedure Laterality Date   ABDOMINAL HYSTERECTOMY     There are no active problems to display for this patient.   PCP: none  REFERRING PROVIDER: Rober Chimera MD  REFERRING DIAG: left shoulder strain  THERAPY DIAG:  Left shoulder pain, left shoulder stiffness, weakness Rationale for Evaluation and Treatment: Rehabilitation  ONSET DATE: Jan 8  SUBJECTIVE:                                                                                                                                                                                      SUBJECTIVE STATEMENT: Today is a low day.  Rainy days are hard with the grief.  I've been referred to a [facility] involuntary movement clinic to rule out Parkinsons.  My shoulder is 70% better.  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 Yes NPRS scale: 1/10 Pain location: left upper arm, some right shoulder too Pain orientation: Left  PAIN TYPE: aching Pain description: intermittent  Aggravating  factors: overhead elevation; lying on it left side, putting on a jacket Relieving factors: heat, Motrin  as needed   PRECAUTIONS: work can't go unless 10 hrs/week and no lift 25# ([employer])   WEIGHT BEARING RESTRICTIONS: No  FALLS:  Has patient fallen in last 6 months? Yes. Number of falls 1  LIVING ENVIRONMENT: Lives in: House/apartment   OCCUPATION: [employer] has to lift 50# (out of work until 6/6)  PLOF: Independent  PATIENT GOALS:decrease pain and strength back  NEXT MD VISIT: Dr. Hildy Lowers as needed  OBJECTIVE:  Note: Objective  measures were completed at Evaluation unless otherwise noted.  DIAGNOSTIC FINDINGS:  11/23/23: CLINICAL DATA:  Walking her puppy any pulled her down, landing on left arm. Left arm in a sling. EXAM: LEFT SHOULDER COMPARISON:  None Available.   FINDINGS: Calcific densities or osseous fragments project inferior to the coracoid process on AP view and medial to the scapula on Y-view. These are indeterminate and may be chronic however acute fracture is difficult to exclude. Consider CT for further evaluation. 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   IMPRESSION: Calcific densities or osseous fragments project inferior to the coracoid process on AP view and medial to the scapula on Y-view. These are indeterminate and may be chronic however acute fracture is difficult to exclude. Consider CT for further evaluation. 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 CLINICAL DATA:  Recent fall with left arm pain, initial encounter   EXAM: LEFT HUMERUS - 2+ VIEW   COMPARISON:  None Available.   FINDINGS: Degenerative changes of the acromioclavicular joint are seen. No acute fracture or dislocation is noted. Mild olecranon spurring is noted.   IMPRESSION: No acute abnormality noted.  PATIENT SURVEYS:  Eval:  Quick Dash 63.6% 02/28/2024:  Quick Dash  68.18% 5/13: 34.1%  COGNITION: Overall cognitive status: Within functional limits for tasks  assessed     UPPER EXTREMITY ROM:   Active ROM Right eval Left eval 4/1 left 4/10 4/22 5/1 5/6 5/8 5/13  Shoulder flexion 160 140 Active assisted 150 degrees 140 "comfortable" actively; active assisted 150 132 active 145 150  157  Shoulder extension           Shoulder abduction 162 77 painful   108 95 painful    145  142  Shoulder adduction           Shoulder internal rotation T10 L1  L5 hurts a little  Thumb to L5    L1 painful  Shoulder external rotation 72 50 painful  56 57    75  Elbow flexion           Elbow extension           Wrist flexion           Wrist extension           Wrist ulnar deviation           Wrist radial deviation           Wrist pronation           Wrist supination           (Blank rows = not tested)  UPPER EXTREMITY MMT:  MMT Right eval Left eval Left 02/28/24 5/13 LEFT  Shoulder flexion 5 3 4  4+  Shoulder extension 5 4 4 5   Shoulder abduction 5 2+ 3 4  Shoulder adduction      Shoulder internal rotation 5 3 4- 4  Shoulder external rotation 5 3 3+ 4  Middle trapezius      Lower trapezius      Elbow flexion 5 4    Elbow extension 5 4    Wrist flexion      Wrist extension      Wrist ulnar deviation      Wrist radial deviation      Wrist pronation      Wrist supination      Grip strength (lbs)   25 lbs   (Blank rows = not tested)  TREATMENT DATE: 03/27/2024: UBE 6 min total forward/backward while discuss  status  Shoulder ROM Quick DASH 5# weight lowers from the shelf Seated green 3 way pball rollout x10 each direction Given green band for home: Rows, shoulder extension, lat pulls (see HEP) Seated: 1# bil 3 ways 10x each flexion, scaption, abduction  (added to HEP) 03/22/2024: UBE 6 min total forward/backward while discuss status  Wall push up straight and angled 5x each  Discussion of gradual progression of UE weightbearing for return to floor transfers Seated lateral push ups left 5x Seated green 3 way pball rollout x10 each  direction Seated: 1# bil 3 ways 10x each flexion, scaption, abduction  Green ball roll up the wall 10x  Lat bar 30# 15x standing  Abduction from elevated table 80 degrees with 1# 10 reps Shoulder press 1# 10x Seated bil 1# overhead press 10x  Standing cable pull 10# 15x 7# farmer's carry 2 laps around the gym  03/20/2024: UBE 6 min total forward/backward while discuss status  Seated green 3 way pball rollout x10 each direction Green ball roll up the wall 10x  Seated landmine press cane on chair green band anchored underneath her foot for moderate resistance 30x Seated shoulder flexion 1# 10x Diagonal flexion 1# 5x (challenging, initially painful but improved) Lat bar 30# 10x standing  red band D1/D2 PNF diagonals (anchored over top of the door) 10x each way Abduction from elevated table 80 degrees with 1# 10 reps Seated bil 1# overhead press 10x  Standing cable pull 5# 15x UE Ranger on wall in cancer gym 2/3 elevation:  flexion and abduction for left shoulder.  2x15 each  PATIENT EDUCATION: Education details: Educated patient on anatomy and physiology of current symptoms, prognosis, plan of care as well as initial self care strategies to promote recovery Person educated: Patient Education method: Explanation Education comprehension: verbalized understanding  HOME EXERCISE PROGRAM: Access Code: [email or web address] URL: [email or web address] Date: 03/27/2024 Prepared by: Darien Eden  Exercises - Supine Shoulder Flexion Extension AAROM with Dowel  - 2 x daily - 7 x weekly - 1 sets - 10 reps - Supine Shoulder External Rotation AAROM with Dowel  - 2 x daily - 7 x weekly - 1 sets - 10 reps - Seated Single Arm Elbow Flexion with Dumbbell  - 2 x daily - 7 x weekly - 1 sets - 10 reps - Seated Scapular Retraction  - 2 x daily - 7 x weekly - 1 sets - 10 reps - Circular Shoulder Pendulum with Table Support  - 2 x daily - 7 x weekly - 1 sets - 10 reps - Single Arm Serratus Punches in  Supine with Dumbbell  - 1 x daily - 7 x weekly - 2 sets - 10 reps - Supine Shoulder Flexion Extension Full Range AROM  - 1 x daily - 7 x weekly - 2 sets - 10 reps - Wall Clock  - 1 x daily - 7 x weekly - 1 sets - 5 reps - Brandt-Daroff Vestibular Exercise  - 1 x daily - 7 x weekly - 3-5 reps - Standing Shoulder Row with Anchored Resistance  - 1 x daily - 7 x weekly - 1 sets - 10 reps - Shoulder Extension with Resistance (Mirrored)  - 1 x daily - 7 x weekly - 3 sets - 10 reps - Shoulder Lat Pull Down with Resistance Band with PLB  - 1 x daily - 7 x weekly - 1 sets - 10 reps - Seated Single Arm Shoulder Flexion with Dumbbells  - 1 x daily -  7 x weekly - 1 sets - 10 reps - Seated Single Arm Shoulder Abduction with Dumbbell - Thumb Up  - 1 x daily - 7 x weekly - 1 sets - 10 reps - Shoulder Overhead Press in Flexion with Dumbbells  - 1 x daily - 7 x weekly - 1 sets - 10 reps  ASSESSMENT:  CLINICAL IMPRESSION: The patient has met the majority of rehab goals, with noted improvements in pain reduction, outcome score, ROM, strength and functional mobility.  A comprehensive HEP has been established and anticipate further improvements over time with regular performance of the program.  Recommend discharge from PT at this time.    OBJECTIVE IMPAIRMENTS: decreased activity tolerance, decreased ROM, decreased strength, impaired perceived functional ability, impaired UE functional use, and pain.   ACTIVITY LIMITATIONS: carrying, lifting, sleeping, reach over head, and hygiene/grooming  PARTICIPATION LIMITATIONS: meal prep, cleaning, laundry, and occupation  PERSONAL FACTORS: Past/current experiences, 1 comorbidity: DN, history of neck pain/vertigo, and grief over the recent loss of her long time partner are also affecting patient's functional outcome.   REHAB POTENTIAL: Good  CLINICAL DECISION MAKING: Stable/uncomplicated  EVALUATION COMPLEXITY: Low   GOALS: Goals reviewed with patient?  Yes  SHORT TERM GOALS: Target date: 02/29/2024  The patient will demonstrate knowledge of basic self care strategies and exercises to promote healing  Baseline: Goal status: met 4/10  2.  The patient will have improved shoulder elevation ROM to at least 150 degrees needed for grooming/dressing purposes as well as reaching high shelves  Baseline:  Goal status: met 5/6  3.  The patient will have grossly 4-/5 strength needed to lift and lower a 2# object from a high shelf  Baseline:  Goal status: met 5/1  4.  Patient will be able to carry a light to medium shopping bag in left hand Baseline:  Goal status: met 4/22    LONG TERM GOALS: Target date: 03/28/2024   The patient will be independent in a safe self progression of a home exercise program to promote further recovery of function  Baseline:  Goal status: met  2.  The patient will report a 75% improvement in pain levels with functional activities which are currently difficult including cooking, carrying things, grooming/dressing Baseline:  Goal status: partially met 70%  3.  The patient will have improved shoulder elevation ROM to at least 155 degrees needed for grooming/dressing purposes as well as reaching high shelves  Baseline:  Goal status: met  4.  The patient will have grossly 4+/5 strength needed to lift and lower a 5# object from a high shelf  Baseline:  Goal status: partially met  5.  Quick DASH outcome score improved to   50%  indicating improved function with less pain Baseline:  Goal status: met   PLAN: PHYSICAL THERAPY DISCHARGE SUMMARY  Visits from Start of Care: 15  Current functional level related to goals / functional outcomes: See clinical impressions above   Remaining deficits: As above   Education / Equipment: HEP   Patient agrees to discharge. Patient goals were met. Patient is being discharged due to meeting the stated rehab goals.  Darien Eden, PT 03/27/24 6:36 PM Phone:  [phone] Fax: [phone]

## 2024-04-05 NOTE — Progress Notes (Addendum)
 COVID Vaccine received:  []  No [x]  Yes Date of any COVID positive Test in last 90 days: no PCP - Maximo Spar MD. [facility] Cardiologist - n/a  Chest x-ray -  EKG -   Stress Test -  ECHO -  Cardiac Cath -   Bowel Prep - [x]  No  []   Yes ______  Pacemaker / ICD device [x]  No []  Yes   Spinal Cord Stimulator:[x]  No []  Yes       History of Sleep Apnea? []  No [x]  Yes   CPAP used?- [x]  No []  Yes    Does the patient monitor blood sugar?          []  No [x]  Yes  []  N/A  Patient has: []  NO Hx DM   []  Pre-DM                 []  DM1  [x]   DM2 Does patient have a [employer] or Dexacom? [x]  No []  Yes   Fasting Blood Sugar Ranges- 130's Checks Blood Sugar ___1__ times a month  GLP1 agonist / usual dose - Trulicity instructed not to take this Saturday 04/21/24 GLP1 instructions:  SGLT-2 inhibitors / usual dose -  SGLT-2 instructions:   Blood Thinner / Instructions:no Aspirin Instructions:ASA 325 mg Instructed to stop 04/21/24  Comments:   Activity level: Patient is able  to climb a flight of stairs without difficulty; [x]  No CP  [x]  No SOB,Patient canperform ADLs without assistance.   Anesthesia review: HTN, DM, abnl EKG,  Pt. Had bronchitis 1 week ago. MD gave a script in case it got worse. Pt. Felt better and did not take the antibiotic. Denies fever . Has occasional cough. Pt. Says d/t post nasal drip.Denies SOB  Patient denies shortness of breath, fever, cough and chest pain at PAT appointment.  Patient verbalized understanding and agreement to the Pre-Surgical Instructions that were given to them at this PAT appointment. Patient was also educated of the need to review these PAT instructions again prior to his/her surgery.I reviewed the appropriate phone numbers to call if they have any and questions or concerns.

## 2024-04-05 NOTE — Patient Instructions (Addendum)
 SURGICAL WAITING ROOM VISITATION  Patients having surgery or a procedure may have no more than 2 support people in the waiting area - these visitors may rotate.    Children under the age of 44 must have an adult with them who is not the patient.  Due to an increase in RSV and influenza rates and associated hospitalizations, children ages 50 and under may not visit patients in [facility] hospitals.  Visitors with respiratory illnesses are discouraged from visiting and should remain at home.  If the patient needs to stay at the hospital during part of their recovery, the visitor guidelines for inpatient rooms apply. Pre-op nurse will coordinate an appropriate time for 1 support person to accompany patient in pre-op.  This support person may not rotate.    Please refer to the [facility] website for the visitor guidelines for Inpatients (after your surgery is over and you are in a regular room).       Your procedure is scheduled on: 04/27/24   Report to [facility] Main Entrance    Report to admitting at 5:15 AM   Call this number if you have problems the morning of surgery [phone]   Do not eat food or drink any liquids:After Midnight.but mat have sips of water with meds.     FOLLOW BOWEL PREP AND ANY ADDITIONAL PRE OP INSTRUCTIONS YOU RECEIVED FROM YOUR SURGEON'S OFFICE!!!     Oral Hygiene is also important to reduce your risk of infection.                                    Remember - BRUSH YOUR TEETH THE MORNING OF SURGERY WITH YOUR REGULAR TOOTHPASTE   Stop all vitamins and herbal supplements 7 days before surgery.   Take these medicines the morning of surgery with A SIP OF WATER: Amlodipine, Propanolol(inderal), Rosuvastatin  DO NOT TAKE ANY ORAL DIABETIC MEDICATIONS DAY OF YOUR SURGERY. Hold Metformin the morning of surgery. Stop taking Aspirin and Trulicity 04/21/24.  Bring CPAP mask and tubing day of surgery.                              You may not have  any metal on your body including hair pins, jewelry, and body piercing             Do not wear make-up, lotions, powders, perfumes/cologne, or deodorant  Do not wear nail polish including gel and S&S, artificial/acrylic nails, or any other type of covering on natural nails including finger and toenails. If you have artificial nails, gel coating, etc. that needs to be removed by a nail salon please have this removed prior to surgery or surgery may need to be canceled/ delayed if the surgeon/ anesthesia feels like they are unable to be safely monitored.   Do not shave  48 hours prior to surgery.    Do not bring valuables to the hospital. [facility] IS NOT             RESPONSIBLE   FOR VALUABLES.   Contacts, glasses, dentures or bridgework may not be worn into surgery.  DO NOT BRING YOUR HOME MEDICATIONS TO THE HOSPITAL. PHARMACY WILL DISPENSE MEDICATIONS LISTED ON YOUR MEDICATION LIST TO YOU DURING YOUR ADMISSION IN THE HOSPITAL!    Patients discharged on the day of surgery will not be allowed to  drive home.  Someone NEEDS to stay with you for the first 24 hours after anesthesia.   Special Instructions: Bring a copy of your healthcare power of attorney and living will documents the day of surgery if you haven't scanned them before.              Please read over the following fact sheets you were given: IF YOU HAVE QUESTIONS ABOUT YOUR PRE-OP INSTRUCTIONS PLEASE CALL [phone] Madison Tate   If you received a COVID test during your pre-op visit  it is requested that you wear a mask when out in public, stay away from anyone that may not be feeling well and notify your surgeon if you develop symptoms. If you test positive for Covid or have been in contact with anyone that has tested positive in the last 10 days please notify you surgeon.    [facility] - Preparing for Surgery Before surgery, you can play an important role.  Because skin is not sterile, your skin needs to be as free of germs as  possible.  You can reduce the number of germs on your skin by washing with CHG (chlorahexidine gluconate) soap before surgery.  CHG is an antiseptic cleaner which kills germs and bonds with the skin to continue killing germs even after washing. Please DO NOT use if you have an allergy to CHG or antibacterial soaps.  If your skin becomes reddened/irritated stop using the CHG and inform your nurse when you arrive at Short Stay. Do not shave (including legs and underarms) for at least 48 hours prior to the first CHG shower.  You may shave your face/neck.  Please follow these instructions carefully:  1.  Shower with CHG Soap the night before surgery and the  morning of surgery.  2.  If you choose to wash your hair, wash your hair first as usual with your normal  shampoo.  3.  After you shampoo, rinse your hair and body thoroughly to remove the shampoo.                             4.  Use CHG as you would any other liquid soap.  You can apply chg directly to the skin and wash.  Gently with a scrungie or clean washcloth.  5.  Apply the CHG Soap to your body ONLY FROM THE NECK DOWN.   Do   not use on face/ open                           Wound or open sores. Avoid contact with eyes, ears mouth and   genitals (private parts).                       Wash face,  Genitals (private parts) with your normal soap.             6.  Wash thoroughly, paying special attention to the area where your    surgery  will be performed.  7.  Thoroughly rinse your body with warm water from the neck down.  8.  DO NOT shower/wash with your normal soap after using and rinsing off the CHG Soap.                9.  Pat yourself dry with a clean towel.            10.  Wear  clean pajamas.            11.  Place clean sheets on your bed the night of your first shower and do not  sleep with pets. Day of Surgery : Do not apply any lotions/deodorants the morning of surgery.  Please wear clean clothes to the hospital/surgery  center.  FAILURE TO FOLLOW THESE INSTRUCTIONS MAY RESULT IN THE CANCELLATION OF YOUR SURGERY    How to manage diabetes before and after surgery  Why is it important to control my blood sugar before and after surgery? Improving blood sugar levels before and after surgery helps healing and can limit problems. A way of improving blood sugar control is eating a healthy diet by:  Eating less sugar and carbohydrates  Increasing activity/exercise  Talking with your doctor about reaching your blood sugar goals High blood sugars (greater than 180 mg/dL) can raise your risk of infections and slow your recovery, so you will need to focus on controlling your diabetes during the weeks before surgery. Make sure that the doctor who takes care of your diabetes knows about your planned surgery including the date and location.  How do I manage my blood sugar before surgery? Check your blood sugar at least 4 times a day, starting 2 days before surgery, to make sure that the level is not too high or low. Check your blood sugar the morning of your surgery when you wake up and every 2 hours until you get to the Short Stay unit. If your blood sugar is less than 70 mg/dL, you will need to treat for low blood sugar: Do not take insulin. Treat a low blood sugar (less than 70 mg/dL) with  cup of clear juice (cranberry or apple), 4 glucose tablets, OR glucose gel. Recheck blood sugar in 15 minutes after treatment (to make sure it is greater than 70 mg/dL). If your blood sugar is not greater than 70 mg/dL on recheck, call [phone] for further instructions. Report your blood sugar to the short stay nurse when you get to Short Stay.  If you are admitted to the hospital after surgery: Your blood sugar will be checked by the staff and you will probably be given insulin after surgery (instead of oral diabetes medicines) to make sure you have good blood sugar levels. The goal for blood sugar control after surgery is  80-180 mg/dL.   WHAT DO I DO ABOUT MY DIABETES MEDICATION? Hold Metformin the morning of surgery.  Patient Signature:  Date:   Nurse Signature:  Date:

## 2024-04-16 ENCOUNTER — Encounter (HOSPITAL_COMMUNITY)
Admission: RE | Admit: 2024-04-16 | Discharge: 2024-04-16 | Disposition: A | Discharge status: Home / Self Care | Source: Ambulatory Visit | Attending: Surgery | Admitting: Surgery

## 2024-04-16 ENCOUNTER — Other Ambulatory Visit: Payer: Self-pay

## 2024-04-16 ENCOUNTER — Encounter (HOSPITAL_COMMUNITY): Payer: Self-pay

## 2024-04-16 VITALS — BP 139/81 | HR 68 | Temp 98.2°F | Resp 16 | Ht 66.0 in | Wt 187.0 lb

## 2024-04-16 DIAGNOSIS — E119 Type 2 diabetes mellitus without complications: Secondary | ICD-10-CM | POA: Insufficient documentation

## 2024-04-16 DIAGNOSIS — K8070 Calculus of gallbladder and bile duct without cholecystitis without obstruction: Secondary | ICD-10-CM | POA: Diagnosis not present

## 2024-04-16 DIAGNOSIS — Z01818 Encounter for other preprocedural examination: Secondary | ICD-10-CM | POA: Diagnosis present

## 2024-04-16 DIAGNOSIS — G250 Essential tremor: Secondary | ICD-10-CM | POA: Diagnosis not present

## 2024-04-16 DIAGNOSIS — R1031 Right lower quadrant pain: Secondary | ICD-10-CM | POA: Insufficient documentation

## 2024-04-16 DIAGNOSIS — I10 Essential (primary) hypertension: Secondary | ICD-10-CM | POA: Diagnosis not present

## 2024-04-16 DIAGNOSIS — Z8673 Personal history of transient ischemic attack (TIA), and cerebral infarction without residual deficits: Secondary | ICD-10-CM | POA: Diagnosis not present

## 2024-04-16 HISTORY — DX: Other complications of anesthesia, initial encounter: T88.59XA

## 2024-04-16 HISTORY — DX: Myoneural disorder, unspecified: G70.9

## 2024-04-16 HISTORY — DX: Unspecified osteoarthritis, unspecified site: M19.90

## 2024-04-16 LAB — CBC
HCT: 46.7 % — ABNORMAL HIGH (ref 36.0–46.0)
Hemoglobin: 15.4 g/dL — ABNORMAL HIGH (ref 12.0–15.0)
MCH: 28.4 pg (ref 26.0–34.0)
MCHC: 33 g/dL (ref 30.0–36.0)
MCV: 86 fL (ref 80.0–100.0)
Platelets: 288 10*3/uL (ref 150–400)
RBC: 5.43 MIL/uL — ABNORMAL HIGH (ref 3.87–5.11)
RDW: 12.2 % (ref 11.5–15.5)
WBC: 5.5 10*3/uL (ref 4.0–10.5)
nRBC: 0 % (ref 0.0–0.2)

## 2024-04-16 LAB — BASIC METABOLIC PANEL WITH GFR
Anion gap: 7 (ref 5–15)
BUN: 17 mg/dL (ref 8–23)
CO2: 25 mmol/L (ref 22–32)
Calcium: 9 mg/dL (ref 8.9–10.3)
Chloride: 104 mmol/L (ref 98–111)
Creatinine, Ser: 0.62 mg/dL (ref 0.44–1.00)
GFR, Estimated: >60 mL/min (ref >60)
Glucose, Bld: 119 mg/dL — ABNORMAL HIGH (ref 70–99)
Potassium: 4 mmol/L (ref 3.5–5.1)
Sodium: 136 mmol/L (ref 135–145)

## 2024-04-16 LAB — HEMOGLOBIN A1C
Hgb A1c MFr Bld: 6.6 % — ABNORMAL HIGH (ref 4.8–5.6)
Mean Plasma Glucose: 142.72 mg/dL

## 2024-04-16 LAB — GLUCOSE, CAPILLARY: Glucose-Capillary: 132 mg/dL — ABNORMAL HIGH (ref 70–99)

## 2024-04-16 NOTE — Progress Notes (Signed)
 Pt. Here today for PST visit. She reports she was diagnosed 1 week ago with bronchitis. The MD gave her a script in case it got worse. Pt. Began to feel better and did not take the antobiotic. Denies fever. VSS. Has occasional cough she says is due to post nasal drip. Denies SOB.

## 2024-04-18 ENCOUNTER — Encounter (HOSPITAL_COMMUNITY): Payer: Self-pay

## 2024-04-18 NOTE — Progress Notes (Signed)
 Case: [id number] Date/Time: 04/27/24 0715   Procedures:      LAPAROSCOPY, DIAGNOSTIC     LAPAROSCOPIC CHOLECYSTECTOMY - LAPAROSCOPIC CHOLECYSTECTOMY WITH ICG   Anesthesia type: General   Diagnosis:      Right lower quadrant abdominal pain [R10.31]     Gallbladder calculus without cholecystitis and no obstruction [K80.20]   Pre-op diagnosis: BILIARY COLIC   Location: [facility] ROOM 01 / [facility]   Surgeons: Adalberto Acton, MD       DISCUSSION: Madison Tate is a 73 year old female who presents to PAT prior to surgery above.  Past medical history significant for history of HTN, hx of TIA, essential tremor, diabetes (A1c 6.6), anxiety, arthritis.  Prior complications from anesthesia include "small airway".  Patient has been following with general surgery due to chronic intermittent abdominal pain.  Prior imaging suggestive of cholelithiasis without cholecystitis and no other acute intra-abdominal findings.  General surgery recommending diagnostic laparoscopy due to atypical abdominal pain with history of adhesions.  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  Stable at last visit on 03/22/2024  Seen by PCP on 04/10/2024 for acute bronchitis.  Symptoms started on 04/03/2024.  She was prescribed a Z-Pak and Tessalon.  At PAT visit she states she never took the Z-Pak because she was symptomatically improving.  She reports residual postnasal drip and dry cough..  Anticipate she can proceed if respiratory status is stable on day of surgery.   LD Trulicity: 04/21/2024  VS: BP 139/81   Pulse 68   Temp 36.8 C (Oral)   Resp 16   Ht 5\' 6"  (1.676 m)   Wt 84.8 kg   SpO2 96%   BMI 30.18 kg/m   PROVIDERS: Claudell Cruz, MD   LABS: Labs reviewed: Acceptable for surgery. (all labs ordered are listed, but only abnormal results are displayed)  Labs Reviewed  HEMOGLOBIN A1C - Abnormal; Notable for the following components:      Result Value   Hgb A1c MFr Bld 6.6 (*)     All other components within normal limits  BASIC METABOLIC PANEL WITH GFR - Abnormal; Notable for the following components:   Glucose, Bld 119 (*)    All other components within normal limits  CBC - Abnormal; Notable for the following components:   RBC 5.43 (*)    Hemoglobin 15.4 (*)    HCT 46.7 (*)    All other components within normal limits  GLUCOSE, CAPILLARY - Abnormal; Notable for the following components:   Glucose-Capillary 132 (*)    All other components within normal limits     IMAGES:  CT chest 12/20/23 ([facility]):  FINDINGS:  Lungs and pleura: Patent central airways. No pleural effusion or pneumothorax. Emphysema.  Mediastinum/Soft Tissues: No coronary calcifications. No adenopathy. 1.2 cm left thyroid nodule. Consider follow-up ultrasound.  Upper Abdomen: Grossly unremarkable allowing for low dose technique.  Bones: No acute or aggressive bony abnormality.  Nodule assessment: 1.  4 mm right lower lobe nodule image 277 of series 2.   CT abdomen/pelvis 11/30/2023  IMPRESSION: 1. No acute abdominal/pelvic findings, mass lesions or adenopathy. 2. Cholelithiasis but no CT findings for acute cholecystitis. 3. Status post hysterectomy. 4. Aortic atherosclerosis.  EKG 04/16/2024: Normal sinus rhythm Left axis deviation Moderate voltage criteria for LVH, may be normal variant ( R in aVL , [employer] product )  CV:    Past Medical History:  Diagnosis Date   Anxiety    Arthritis    Complication of  anesthesia    small airway   Diabetes mellitus without complication (HCC)    Hypercholesteremia    Hypertension    Neuromuscular disorder (HCC)    essential tremors   TIA (transient ischemic attack)     Past Surgical History:  Procedure Laterality Date   ABDOMINAL HYSTERECTOMY     CERVICAL CONE BIOPSY     x3   CESAREAN SECTION     x 3   DILATION AND CURETTAGE OF UTERUS     x 10   LAPAROSCOPY      MEDICATIONS:  albuterol (VENTOLIN HFA) 108 (90 Base)  MCG/ACT inhaler   amLODipine (NORVASC) 5 MG tablet   aspirin 325 MG tablet   doxycycline  (VIBRAMYCIN ) 100 MG capsule   ezetimibe (ZETIA) 10 MG tablet   Investigational - Study Medication   metFORMIN (GLUCOPHAGE) 500 MG tablet   Multiple Vitamin (MULTIVITAMIN WITH MINERALS) TABS tablet   propranolol ER (INDERAL LA) 80 MG 24 hr capsule   TRULICITY 1.5 MG/0.5ML SOAJ   No current facility-administered medications for this encounter.    [name] [facility] Short Stay/Anesthesiology [facility] Phone [phone] 04/18/2024 10:57 AM

## 2024-04-18 NOTE — Anesthesia Preprocedure Evaluation (Addendum)
 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 2011)  Airway Mallampati: II  TM Distance: >3 FB Neck ROM: Full    Dental no notable dental hx.    Pulmonary neg pulmonary ROS, neg sleep apnea   Pulmonary exam normal breath sounds clear to auscultation       Cardiovascular hypertension, Normal cardiovascular exam Rhythm:Regular Rate:Normal     Neuro/Psych  PSYCHIATRIC DISORDERS Anxiety     TIA   GI/Hepatic negative GI ROS, Neg liver ROS,,,  Endo/Other  diabetes, Type 2    Renal/GU negative Renal ROS  negative genitourinary   Musculoskeletal  (+) Arthritis ,    Abdominal   Peds negative pediatric ROS (+)  Hematology negative hematology ROS (+)   Anesthesia Other Findings Last GLP1 04/20/2024  Reproductive/Obstetrics negative OB ROS                             Anesthesia Physical Anesthesia Plan  ASA: 3  Anesthesia Plan: General   Post-op Pain Management: Tylenol  PO (pre-op)*   Induction: Intravenous  PONV Risk Score and Plan: 3 and Ondansetron, Dexamethasone and Treatment may vary due to age or medical condition  Airway Management Planned: Oral ETT  Additional Equipment: None  Intra-op Plan:   Post-operative Plan: Extubation in OR  Informed Consent: I have reviewed the patients History and Physical, chart, labs and discussed the procedure including the risks, benefits and alternatives for the proposed anesthesia with the patient or authorized representative who has indicated his/her understanding and acceptance.     Dental advisory given  Plan Discussed with:  CRNA  Anesthesia Plan Comments: (See PAT note from 6/2  Madison Tate is a 73 year old female who presents to PAT prior to surgery above.  Past medical history significant for history of HTN, hx of TIA, essential tremor, diabetes (A1c 6.6), anxiety, arthritis.   Prior complications from anesthesia include small airway.   Patient has been following with general surgery due to chronic intermittent abdominal pain.  Prior imaging suggestive of cholelithiasis without cholecystitis and no other acute intra-abdominal findings.  General surgery recommending diagnostic laparoscopy due to atypical abdominal pain with history of adhesions.   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  Stable at last visit on 03/22/2024   Seen by PCP on 04/10/2024 for acute bronchitis.  Symptoms started on 04/03/2024.  She was prescribed a Z-Pak and Tessalon.  At PAT visit she states she never took the Z-Pak because she was symptomatically improving.  She reports residual postnasal drip and dry cough..  Anticipate she can proceed if respiratory status is stable on day of surgery.     LD Trulicity: 04/21/2024 )        Anesthesia Quick Evaluation

## 2024-04-27 ENCOUNTER — Encounter (HOSPITAL_COMMUNITY): Payer: Self-pay | Admitting: Surgery

## 2024-04-27 ENCOUNTER — Ambulatory Visit (HOSPITAL_BASED_OUTPATIENT_CLINIC_OR_DEPARTMENT_OTHER): Payer: Self-pay | Admitting: Certified Registered"

## 2024-04-27 ENCOUNTER — Ambulatory Visit (HOSPITAL_COMMUNITY): Payer: Self-pay | Admitting: Physician Assistant

## 2024-04-27 ENCOUNTER — Encounter (HOSPITAL_COMMUNITY)
Admission: RE | Disposition: A | Discharge status: Home / Self Care | Payer: Self-pay | Source: Home / Self Care | Attending: Surgery

## 2024-04-27 ENCOUNTER — Other Ambulatory Visit: Payer: Self-pay

## 2024-04-27 ENCOUNTER — Ambulatory Visit (HOSPITAL_COMMUNITY)
Admission: RE | Admit: 2024-04-27 | Discharge: 2024-04-27 | Disposition: A | Discharge status: Home / Self Care | Attending: Surgery | Admitting: Surgery

## 2024-04-27 DIAGNOSIS — Z7985 Long-term (current) use of injectable non-insulin antidiabetic drugs: Secondary | ICD-10-CM | POA: Insufficient documentation

## 2024-04-27 DIAGNOSIS — K8044 Calculus of bile duct with chronic cholecystitis without obstruction: Secondary | ICD-10-CM

## 2024-04-27 DIAGNOSIS — I10 Essential (primary) hypertension: Secondary | ICD-10-CM

## 2024-04-27 DIAGNOSIS — K8010 Calculus of gallbladder with chronic cholecystitis without obstruction: Secondary | ICD-10-CM | POA: Diagnosis not present

## 2024-04-27 DIAGNOSIS — E7800 Pure hypercholesterolemia, unspecified: Secondary | ICD-10-CM

## 2024-04-27 DIAGNOSIS — R1031 Right lower quadrant pain: Secondary | ICD-10-CM | POA: Diagnosis present

## 2024-04-27 DIAGNOSIS — K8020 Calculus of gallbladder without cholecystitis without obstruction: Secondary | ICD-10-CM | POA: Diagnosis present

## 2024-04-27 DIAGNOSIS — K8064 Calculus of gallbladder and bile duct with chronic cholecystitis without obstruction: Secondary | ICD-10-CM | POA: Diagnosis not present

## 2024-04-27 DIAGNOSIS — E119 Type 2 diabetes mellitus without complications: Secondary | ICD-10-CM | POA: Insufficient documentation

## 2024-04-27 HISTORY — PX: LAPAROSCOPY: SHX197

## 2024-04-27 HISTORY — PX: CHOLECYSTECTOMY: SHX55

## 2024-04-27 LAB — GLUCOSE, CAPILLARY
Glucose-Capillary: 154 mg/dL — ABNORMAL HIGH (ref 70–99)
Glucose-Capillary: 178 mg/dL — ABNORMAL HIGH (ref 70–99)

## 2024-04-27 SURGERY — LAPAROSCOPY, DIAGNOSTIC
Anesthesia: General

## 2024-04-27 MED ORDER — SUGAMMADEX SODIUM 200 MG/2ML IV SOLN
INTRAVENOUS | Status: DC | PRN
  Administered 2024-04-27: 180 mg via INTRAVENOUS

## 2024-04-27 MED ORDER — ONDANSETRON HCL 4 MG/2ML IJ SOLN
INTRAMUSCULAR | Status: AC
  Filled 2024-04-27: qty 2

## 2024-04-27 MED ORDER — BUPIVACAINE LIPOSOME 1.3 % IJ SUSP: 20.0000 mL | Freq: Once | INTRAMUSCULAR | Status: DC

## 2024-04-27 MED ORDER — PROPOFOL 10 MG/ML IV BOLUS
INTRAVENOUS | Status: DC | PRN
  Administered 2024-04-27: 120 mg via INTRAVENOUS

## 2024-04-27 MED ORDER — ONDANSETRON HCL 4 MG/2ML IJ SOLN
INTRAMUSCULAR | Status: DC | PRN
Start: 2024-04-27 — End: 2024-04-27
  Administered 2024-04-27: 4 mg via INTRAVENOUS

## 2024-04-27 MED ORDER — PHENYLEPHRINE HCL (PRESSORS) 10 MG/ML IV SOLN: INTRAVENOUS | Status: DC | PRN

## 2024-04-27 MED ORDER — TRAMADOL HCL 50 MG PO TABS: 50.0000 mg | ORAL_TABLET | Freq: Four times a day (QID) | ORAL | 0 refills | Status: AC | PRN

## 2024-04-27 MED ORDER — PHENYLEPHRINE 80 MCG/ML (10ML) SYRINGE FOR IV PUSH (FOR BLOOD PRESSURE SUPPORT)
PREFILLED_SYRINGE | INTRAVENOUS | Status: DC | PRN
  Administered 2024-04-27 (×2): 80 ug via INTRAVENOUS
  Administered 2024-04-27: 40 ug via INTRAVENOUS

## 2024-04-27 MED ORDER — OXYCODONE HCL 5 MG PO TABS: 5.0000 mg | ORAL_TABLET | Freq: Once | ORAL | Status: DC | PRN

## 2024-04-27 MED ORDER — CHLORHEXIDINE GLUCONATE 4 % EX SOLN: 60.0000 mL | Freq: Once | CUTANEOUS | Status: DC

## 2024-04-27 MED ORDER — FENTANYL CITRATE (PF) 100 MCG/2ML IJ SOLN
INTRAMUSCULAR | Status: AC
  Filled 2024-04-27: qty 2

## 2024-04-27 MED ORDER — LACTATED RINGERS IR SOLN
Status: DC | PRN
Start: 2024-04-27 — End: 2024-04-27
  Administered 2024-04-27: 1000 mL

## 2024-04-27 MED ORDER — PROPOFOL 10 MG/ML IV BOLUS
INTRAVENOUS | Status: AC
  Filled 2024-04-27: qty 20

## 2024-04-27 MED ORDER — DEXAMETHASONE SODIUM PHOSPHATE 10 MG/ML IJ SOLN
INTRAMUSCULAR | Status: DC | PRN
  Administered 2024-04-27: 4 mg via INTRAVENOUS

## 2024-04-27 MED ORDER — BUPIVACAINE-EPINEPHRINE 0.25% -1:200000 IJ SOLN
INTRAMUSCULAR | Status: DC | PRN
  Administered 2024-04-27: 30 mL

## 2024-04-27 MED ORDER — ROCURONIUM BROMIDE 10 MG/ML (PF) SYRINGE
PREFILLED_SYRINGE | INTRAVENOUS | Status: DC | PRN
Start: 2024-04-27 — End: 2024-04-27
  Administered 2024-04-27: 50 mg via INTRAVENOUS
  Administered 2024-04-27: 10 mg via INTRAVENOUS

## 2024-04-27 MED ORDER — FENTANYL CITRATE (PF) 100 MCG/2ML IJ SOLN
INTRAMUSCULAR | Status: AC
Start: 2024-04-27 — End: 2024-04-27
  Filled 2024-04-27: qty 2

## 2024-04-27 MED ORDER — ACETAMINOPHEN 500 MG PO TABS
1000.0000 mg | ORAL_TABLET | Freq: Once | ORAL | Status: AC
  Administered 2024-04-27: 1000 mg via ORAL
  Filled 2024-04-27: qty 2

## 2024-04-27 MED ORDER — BUPIVACAINE LIPOSOME 1.3 % IJ SUSP
INTRAMUSCULAR | Status: AC
  Filled 2024-04-27: qty 20

## 2024-04-27 MED ORDER — ORAL CARE MOUTH RINSE
15.0000 mL | Freq: Once | OROMUCOSAL | Status: AC
Start: 2024-04-27 — End: 2024-04-27

## 2024-04-27 MED ORDER — DEXAMETHASONE SODIUM PHOSPHATE 10 MG/ML IJ SOLN
INTRAMUSCULAR | Status: AC
  Filled 2024-04-27: qty 1

## 2024-04-27 MED ORDER — MIDAZOLAM HCL 2 MG/2ML IJ SOLN
INTRAMUSCULAR | Status: AC
  Filled 2024-04-27: qty 2

## 2024-04-27 MED ORDER — LIDOCAINE HCL (CARDIAC) PF 100 MG/5ML IV SOSY
PREFILLED_SYRINGE | INTRAVENOUS | Status: DC | PRN
  Administered 2024-04-27: 80 mg via INTRAVENOUS

## 2024-04-27 MED ORDER — DOCUSATE SODIUM 100 MG PO CAPS
100.0000 mg | ORAL_CAPSULE | Freq: Two times a day (BID) | ORAL | 0 refills | Status: AC
Start: 2024-04-27 — End: 2024-05-27

## 2024-04-27 MED ORDER — INDOCYANINE GREEN 25 MG IV SOLR
1.2500 mg | Freq: Once | INTRAVENOUS | Status: AC
  Administered 2024-04-27: 1.25 mg via INTRAVENOUS
  Filled 2024-04-27: qty 10

## 2024-04-27 MED ORDER — DROPERIDOL 2.5 MG/ML IJ SOLN
0.6250 mg | Freq: Once | INTRAMUSCULAR | Status: AC | PRN
  Administered 2024-04-27: 0.625 mg via INTRAVENOUS

## 2024-04-27 MED ORDER — BUPIVACAINE-EPINEPHRINE (PF) 0.25% -1:200000 IJ SOLN
INTRAMUSCULAR | Status: AC
  Filled 2024-04-27: qty 30

## 2024-04-27 MED ORDER — FENTANYL CITRATE (PF) 100 MCG/2ML IJ SOLN
INTRAMUSCULAR | Status: DC | PRN
  Administered 2024-04-27 (×3): 50 ug via INTRAVENOUS

## 2024-04-27 MED ORDER — CHLORHEXIDINE GLUCONATE 0.12 % MT SOLN
15.0000 mL | Freq: Once | OROMUCOSAL | Status: AC
  Administered 2024-04-27: 15 mL via OROMUCOSAL

## 2024-04-27 MED ORDER — ACETAMINOPHEN 10 MG/ML IV SOLN: 1000.0000 mg | Freq: Once | INTRAVENOUS | Status: DC | PRN

## 2024-04-27 MED ORDER — FENTANYL CITRATE PF 50 MCG/ML IJ SOSY: 25.0000 ug | PREFILLED_SYRINGE | INTRAMUSCULAR | Status: DC | PRN

## 2024-04-27 MED ORDER — DROPERIDOL 2.5 MG/ML IJ SOLN
INTRAMUSCULAR | Status: AC
  Filled 2024-04-27: qty 2

## 2024-04-27 MED ORDER — CEFAZOLIN SODIUM-DEXTROSE 2-4 GM/100ML-% IV SOLN
2.0000 g | INTRAVENOUS | Status: AC
  Administered 2024-04-27: 2 g via INTRAVENOUS
  Filled 2024-04-27: qty 100

## 2024-04-27 MED ORDER — 0.9 % SODIUM CHLORIDE (POUR BTL) OPTIME
TOPICAL | Status: DC | PRN
  Administered 2024-04-27: 1000 mL

## 2024-04-27 MED ORDER — INSULIN ASPART 100 UNIT/ML IJ SOLN: 0.0000 [IU] | INTRAMUSCULAR | Status: DC | PRN

## 2024-04-27 MED ORDER — ROCURONIUM BROMIDE 10 MG/ML (PF) SYRINGE
PREFILLED_SYRINGE | INTRAVENOUS | Status: AC
  Filled 2024-04-27: qty 10

## 2024-04-27 MED ORDER — BUPIVACAINE LIPOSOME 1.3 % IJ SUSP
INTRAMUSCULAR | Status: DC | PRN
  Administered 2024-04-27: 20 mL

## 2024-04-27 MED ORDER — ACETAMINOPHEN 500 MG PO TABS: 1000.0000 mg | ORAL_TABLET | ORAL | Status: DC

## 2024-04-27 MED ORDER — OXYCODONE HCL 5 MG/5ML PO SOLN: 5.0000 mg | Freq: Once | ORAL | Status: DC | PRN

## 2024-04-27 MED ORDER — MIDAZOLAM HCL 2 MG/2ML IJ SOLN
INTRAMUSCULAR | Status: DC | PRN
  Administered 2024-04-27: 1 mg via INTRAVENOUS

## 2024-04-27 MED ORDER — LACTATED RINGERS IV SOLN: INTRAVENOUS | Status: DC

## 2024-04-27 SURGICAL SUPPLY — 37 items
BAG COUNTER SPONGE SURGICOUNT (BAG) ×1 IMPLANT
BENZOIN TINCTURE PRP APPL 2/3 (GAUZE/BANDAGES/DRESSINGS) IMPLANT
BNDG ADH 1X3 SHEER STRL LF (GAUZE/BANDAGES/DRESSINGS) IMPLANT
BNDG COHESIVE 4X5 TAN STRL (GAUZE/BANDAGES/DRESSINGS) IMPLANT
CABLE HIGH FREQUENCY MONO STRZ (ELECTRODE) ×1 IMPLANT
CHLORAPREP W/TINT 26 (MISCELLANEOUS) ×1 IMPLANT
CLIP APPLIE ROT 10 11.4 M/L (STAPLE) ×1 IMPLANT
COVER MAYO STAND XLG (MISCELLANEOUS) IMPLANT
COVER SURGICAL LIGHT HANDLE (MISCELLANEOUS) ×1 IMPLANT
DERMABOND ADVANCED .7 DNX12 (GAUZE/BANDAGES/DRESSINGS) IMPLANT
DRAPE C-ARM 42X120 X-RAY (DRAPES) IMPLANT
ELECT REM PT RETURN 15FT ADLT (MISCELLANEOUS) ×1 IMPLANT
ENDOLOOP SUT PDS II 0 18 (SUTURE) IMPLANT
GLOVE BIO SURGEON STRL SZ 6 (GLOVE) ×1 IMPLANT
GLOVE INDICATOR 6.5 STRL GRN (GLOVE) ×1 IMPLANT
GOWN STRL REUS W/ TWL LRG LVL3 (GOWN DISPOSABLE) ×1 IMPLANT
GRASPER SUT TROCAR 14GX15 (MISCELLANEOUS) ×1 IMPLANT
HEMOSTAT SNOW SURGICEL 2X4 (HEMOSTASIS) IMPLANT
IRRIGATION SUCT STRKRFLW 2 WTP (MISCELLANEOUS) ×1 IMPLANT
KIT BASIN OR (CUSTOM PROCEDURE TRAY) ×1 IMPLANT
KIT TURNOVER KIT A (KITS) ×1 IMPLANT
NDL INSUFFLATION 14GA 120MM (NEEDLE) ×1 IMPLANT
NEEDLE INSUFFLATION 14GA 120MM (NEEDLE) ×1 IMPLANT
SCISSORS LAP 5X35 DISP (ENDOMECHANICALS) ×1 IMPLANT
SET CHOLANGIOGRAPH MIX (MISCELLANEOUS) IMPLANT
SET TUBE SMOKE EVAC HIGH FLOW (TUBING) ×1 IMPLANT
SLEEVE Z-THREAD 5X100MM (TROCAR) ×1 IMPLANT
SPIKE FLUID TRANSFER (MISCELLANEOUS) ×1 IMPLANT
STRIP CLOSURE SKIN 1/2X4 (GAUZE/BANDAGES/DRESSINGS) IMPLANT
SUT MNCRL AB 4-0 PS2 18 (SUTURE) ×1 IMPLANT
SUT VICRYL 0 UR6 27IN ABS (SUTURE) IMPLANT
SYSTEM BAG RETRIEVAL 10MM (BASKET) IMPLANT
TOWEL OR 17X26 10 PK STRL BLUE (TOWEL DISPOSABLE) ×1 IMPLANT
TRAY LAPAROSCOPIC (CUSTOM PROCEDURE TRAY) ×1 IMPLANT
TROCAR ADV FIXATION 12X100MM (TROCAR) ×1 IMPLANT
TROCAR XCEL NON-BLD 5MMX100MML (ENDOMECHANICALS) IMPLANT
TROCAR Z-THREAD OPTICAL 5X100M (TROCAR) ×1 IMPLANT

## 2024-04-27 NOTE — H&P (Signed)
 Madison Tate [id number]   Referring Provider:  Self   Subjective   Chief Complaint: Follow-up     History of Present Illness: Returns to discuss surgery.  She did undergo CT scan as we had discussed which redemonstrates gallstones but no evidence of cholecystitis and no other acute intra-abdominal or pelvic findings, no evidence of obstruction mass lesions or adenopathy and I discussed this by phone with her on 1/24.  At that time she had had no further abdominal pain since stopping her Ozempic and we reviewed options including continued abstinence from Ozempic, bowel regimen, and proceeding with cholecystectomy.  Given her symptoms at that time as well as ongoing significant grief issues that she was going through, she decided to continue observation at that time.  Plan was to follow-up in around 6 months (around July), sooner if needed.  She has remained off Ozempic (switched to Trulicity but has not actually started taking this), but unfortunately has had some intermittent recurrence of pain.  It remains more lower abdomen than upper and can be pretty miserable.  She is interested in proceeding with cholecystectomy. Of note, last time she had general anesthesia at [facility], she notes that she has a very small airway and "the wrong sized tube" was used which created a "pocket" which led to some issues swallowing for a while.  11/24/23:Very pleasant 73 year old woman with history of anxiety, diabetes, hypertension, hyperlipidemia, TIA and previous abdominal surgery of hysterectomy who presents for evaluation of symptomatic cholelithiasis.  Her medication list does include Ozempic.  She had an ultrasound done at [facility] in April 2024 which shows mildly enlarged liver, echo shadow phenomenon with cholelithiasis completely filling the lumen of the gallbladder with a wall thickness of 2.8 mm and a negative Murphy sign.  Common bile duct 5 mm.  She notes intermittent right sided abdominal pain once or twice a  week.  This is actually slightly inferior to the level of the umbilicus.  Does not radiate.  Not related to eating or any type of food.  Occasional morning nausea but no emesis.  Has noticed increasing constipation, but has not correlated bowel movements or constipation to when the pain comes on.  She did note that she skipped Ozempic for a couple weeks and the pain went away.  She is due for repeat Cologuard this month.  She has what sounds like a fairly extensive gynecological surgical history and has been told on multiple occasions that she has severe adhesive disease.  She states that during her most recent colonoscopy there was quite a lot of difficulty navigating the colon due to anatomy secondary to adhesions.  She has had quite a difficult year.  She lost her childhood friend, followed by her next-door neighbor who was a very dear friend, followed by her significant other in the span of a few months.  She actually had a fall yesterday.  Plan- While she does have gallstones and increased probability of biliary colic secondary to her Ozempic use, her symptoms are not typical and in fact her pain is more lower abdomen.  Given history of adhesive disease we will proceed with CT abdomen and pelvis to evaluate for any partial obstruction, mass or other etiology of her pain.  Review of Systems: A complete review of systems was obtained from the patient.  I have reviewed this information and discussed as appropriate with the patient.  See HPI as well for other ROS.   Medical History: Past Medical History:  Diagnosis Date  Arthritis    Diabetes mellitus type 2, uncomplicated (CMS/HHS-HCC)    Hyperlipidemia    Hypertension    Sleep apnea    Tremor     There is no problem list on file for this patient.   Past Surgical History:  Procedure Laterality Date   TUBAL LIGATION  03-30-1983   CESAREAN SECTION     DILATION AND CURETTAGE, DIAGNOSTIC / THERAPEUTIC     HYSTERECTOMY VAGINAL        Allergies  Allergen Reactions   Primidone Anaphylaxis   Asparagus Hives   Metoprolol Other (See Comments)   Sulfa (Sulfonamide Antibiotics) Other (See Comments)    Bactrim   Atenolol Swelling    Reaction(s): Unknown   Meperidine Other (See Comments)    Reaction(s): Unknown; Note: severe vomiting   Propoxyphene Other (See Comments)    Reaction(s): Unknown; Note: 7=7=99    rmt  [id number]   Sulfamethoxazole-Trimethoprim Rash    Reaction(s): Unknown; Note: [id number] SKD 252 [id number];ZHYQ and severe vomiting   Trokendi Xr [Topiramate] Unknown    Current Outpatient Medications on File Prior to Visit  Medication Sig Dispense Refill   amLODIPine (NORVASC) 5 MG tablet Take by mouth.     aspirin 325 MG tablet Take by mouth.     dulaglutide (TRULICITY) 1.5 mg/0.5 mL subcutaneous pen injector Inject 1.5 mg subcutaneously every 7 (seven) days     ezetimibe (ZETIA) 10 mg tablet Take 10 mg by mouth once daily     multivitamin tablet Take 1 tablet by mouth once daily.     propranoloL (INDERAL LA) 80 MG 24 hr capsule Take 2 capsules (160 mg total) by mouth once daily (Patient taking differently: Take 80 mg by mouth) 90 capsule 3   gabapentin (NEURONTIN) 100 MG capsule Take 1 capsule (100 mg total) by mouth 3 (three) times daily 90 capsule 11   No current facility-administered medications on file prior to visit.    Family History  Problem Relation Age of Onset   Coronary Artery Disease (Blocked arteries around heart) Mother    Hyperlipidemia (Elevated cholesterol) Mother    High blood pressure (Hypertension) Mother    Migraines Mother    Seizures Mother    Stroke Mother    Alcohol abuse Mother    Alzheimer's disease Mother    Glaucoma Mother    Coronary Artery Disease (Blocked arteries around heart) Father    Myocardial Infarction (Heart attack) Father    High blood pressure (Hypertension) Father    Multiple sclerosis Maternal Grandmother      Social History   Tobacco Use  Smoking  Status Former   Current packs/day: 0.00   Average packs/day: 2.0 packs/day for 10.0 years (20.0 ttl pk-yrs)   Types: Cigarettes   Start date: 08/18/1979   Quit date: 08/17/1989   Years since quitting: 34.6  Smokeless Tobacco Never     Social History   Socioeconomic History   Marital status: Widowed  Tobacco Use   Smoking status: Former    Current packs/day: 0.00    Average packs/day: 2.0 packs/day for 10.0 years (20.0 ttl pk-yrs)    Types: Cigarettes    Start date: 08/18/1979    Quit date: 08/17/1989    Years since quitting: 34.6   Smokeless tobacco: Never  Vaping Use   Vaping status: Never Used  Substance and Sexual Activity   Alcohol use: Yes    Alcohol/week: 0.0 standard drinks of alcohol    Comment: On occassions   Drug use: No  Sexual activity: Not Currently    Partners: Male    Birth control/protection: Surgical, None  Other Topics Concern   Would you please tell us  about the people who live in your home, your pets, or anything else important to your social life? No   Social Drivers of Corporate investment banker Strain: Medium Risk (03/14/2024)   Received from [facility]   Overall Financial Resource Strain (CARDIA)    Difficulty of Paying Living Expenses: Somewhat hard  Food Insecurity: No Food Insecurity (03/14/2024)   Received from [facility]   Hunger Vital Sign    Worried About Running Out of Food in the Last Year: Never true    Ran Out of Food in the Last Year: Never true  Transportation Needs: No Transportation Needs (03/14/2024)   Received from [facility] - Transportation    Lack of Transportation (Medical): No    Lack of Transportation (Non-Medical): No  Physical Activity: Sufficiently Active (03/14/2024)   Received from [facility]   Exercise Vital Sign    Days of Exercise per Week: 3 days    Minutes of Exercise per Session: 100 min  Stress: No Stress Concern Present (03/14/2024)   Received from [facility]  of Occupational Health - Occupational Stress Questionnaire    Feeling of Stress : Only a little  Social Connections: Moderately Integrated (03/14/2024)   Received [facility]   Social Network    How would you rate your social network (family, work, friends)?: Adequate participation with social networks  Housing Stability: Low Risk  (03/14/2024)   Received from [facility] Stability Vital Sign    Unable to Pay for Housing in the Last Year: No    Number of Times Moved in the Last Year: 0    Homeless in the Last Year: No    Objective:    Vitals:   03/21/24 1427  PainSc: 0-No pain     There is no height or weight on file to calculate BMI.  Gen: A&Ox3, no distress  Unlabored respirations Abdomen is soft, nondistended, mildly tender in the right lower quadrant, about one third the distance from the umbilicus to the ASIS without palpable mass.  No right upper quadrant tenderness or pain.  Assessment and Plan:  Diagnoses and all orders for this visit:  Right lower quadrant abdominal pain  Calculus of gallbladder without cholecystitis without obstruction   Has had some intermittent pain off Ozempic.  I recommend proceeding with laparoscopic cholecystectomy with possible cholangiogram and discussed also diagnostic laparoscopy to look in the right lower quadrant, despite negative CT still possible she has some adhesive disease contributing to pain.  We discussed that if this was an extensive process, would not recommend aggressive intervention due to risk of bowel injury however if there is a simple band adhesion or obvious process that can be addressed intraoperatively we will do that.  Discussed the relevant anatomy using a diagram to demonstrate, and went over surgical technique.  Discussed risks of surgery including bleeding, infection, pain, scarring, intraabdominal injury specifically to the common bile duct and sequelae, subtotal cholecystectomy, bile leak, conversion  to open surgery, failure to resolve symptoms, post-cholecystectomy diarrhea which is typically self-limited, blood clots/ pulmonary embolus, heart attack, pneumonia, stroke, death. Questions were welcomed and answered to patient's satisfaction.  Patient wishes to proceed with surgery.    Sultana Tierney Madison Cuba, MD

## 2024-04-27 NOTE — Op Note (Signed)
 Operative Note  Madison Tate 73 y.o. female [id number]  04/27/2024  Surgeon: Adalberto Acton MD FACS I personally performed this procedure and immediately available throughout the entire case, as documented in my operative note.  Assistant: Ardyth Kroner MD ([id number])  Procedure performed: Laparoscopic Cholecystectomy with near infrared fluorescent cholangiography; diagnostic laparoscopy  Preop diagnosis: biliary colic Post-op diagnosis/intraop findings: Chronic cholecystitis  Specimens: gallbladder  Retained items: none   EBL: minimal  Complications: none  Description of procedure: After confirming informed consent the patient was brought to the operating room. Antibiotics were administered. SCD's were applied. General endotracheal anesthesia was initiated and a formal time-out was performed. The abdomen was prepped and draped in the usual sterile fashion and the abdomen was entered using a left subcostal Veress needle after instilling the site with local. Insufflation to was obtained, 5mm trocar and camera inserted using optical entry in the right upper quadrant, and gross inspection revealed no evidence of injury from our entry or other intraabdominal abnormalities. Two 5mm trocars were introduced in the supraumbilical and right anterior axillary lines under direct visualization and following infiltration with local. A 12mm trocar was placed in the epigastrium.  The gallbladder appears pale with chronic thickening of the overlying peritoneum and gallbladder wall.  Appearance consistent with chronic cholecystitis.  The gallbladder fundus was retracted cephalad and the infundibulum was retracted laterally. A combination of hook electrocautery and blunt dissection was utilized to clear the peritoneum from the neck and cystic duct, circumferentially isolating the cystic artery and cystic duct and lifting the gallbladder from the cystic plate.  A lymphatic versus small vessel running over  the anterior lateral surface of the cystic duct was singly clipped and divided sharply.  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  This was ultimately very carefully dissected free and skeletonized, protecting the adjacent right hepatic artery.  The critical view of safety was achieved with the cystic artery, cystic duct, and liver bed visualized between them with no other structures. Near infrared fluorescent cholangiography was then activated and demonstrated concordant anatomy; this also illuminated the common bile duct medially confirming that this was well away from the area of dissection and was free of injury.  The cystic artery was clipped with a single clip proximally and distally and divided as was the cystic duct with three clips on the proximal end. The gallbladder was dissected from the liver plate using electrocautery. Once freed the gallbladder was placed in an endocatch bag and removed intact through the epigastric trocar site.  The right upper quadrant was irrigated with warm sterile saline; the effluent was clear. Hemostasis was once again confirmed, and reinspection of the abdomen revealed no injuries. The clips were well apposed without any bile leak from the ligated remnant cystic duct or the liver bed either on direct inspection nor on near infrared fluorescent cholangiography.  At this point given patient's complaint of right lower quadrant pain, the patient was then repositioned in slight Trendelenburg and the right lower quadrant inspected.  The cecum and small bowel in the pelvis all appeared normal and there are no appreciable adhesions either to the abdominal wall or in between loops of bowel.  There are scant adhesions of the sigmoid colon along the left pelvic sidewall which do not appear pathologic or obstructing.  No other  abnormalities were appreciated  on diagnostic laparoscopy. The 12mm trocar site in the epigastrium was closed with a 0 vicryl in the fascia under direct visualization using a PMI device. The abdomen was desufflated and all trocars removed. The skin incisions were closed with subcuticular 4-0 monocryl and Dermabond. The patient was awakened, extubated and transported to the recovery room in stable condition.    All counts were correct at the completion of the case.

## 2024-04-27 NOTE — Transfer of Care (Signed)
 Immediate Anesthesia Transfer of Care Note  Patient: Madison Tate  Procedure(s) Performed: LAPAROSCOPY, DIAGNOSTIC LAPAROSCOPIC CHOLECYSTECTOMY  Patient Location: PACU  Anesthesia Type:General  Level of Consciousness: awake, alert , and patient cooperative  Airway & Oxygen Therapy: Patient Spontanous Breathing and Patient connected to face mask oxygen  Post-op Assessment: Report given to RN and Post -op Vital signs reviewed and stable  Post vital signs: Reviewed and stable  Last Vitals:  Vitals Value Taken Time  BP 101/64 04/27/24 08:49  Temp 36.8 C 04/27/24 08:49  Pulse 72 04/27/24 08:51  Resp 24 04/27/24 08:51  SpO2 100 % 04/27/24 08:51  Vitals shown include unfiled device data.  Last Pain:  Vitals:   04/27/24 0533  TempSrc: Oral  PainSc: 0-No pain         Complications: No notable events documented.

## 2024-04-27 NOTE — Discharge Instructions (Signed)
 LAPAROSCOPIC SURGERY: POST OP INSTRUCTIONS   EAT Gradually transition to a your usual diet over the next few days after discharge.  WALK Walk an hour a day (cumulative- not all at once).  Control your pain to do that.    CONTROL PAIN Control pain so that you can walk, sleep, tolerate sneezing/coughing, go up/down stairs.  HAVE A BOWEL MOVEMENT DAILY Keep your bowels regular to avoid problems.  OK to try a laxative to override constipation.  OK to use an antidiarrheal to slow down diarrhea.  Call if not better after 2 tries  CALL IF YOU HAVE PROBLEMS/CONCERNS Call if you are still struggling despite following these instructions. Call if you have concerns not answered by these instructions    DIET: Follow a light bland diet & liquids the first 24 hours after arrival home, such as soup, liquids, starches, etc.  Be sure to drink plenty of fluids.  Quickly advance to a usual solid diet within a few days.  Avoid fast food or heavy meals initially as you are more likely to get nauseated or have irregular bowels.  A low-sugar, high-fiber diet for the rest of your life is ideal.  Take your usually prescribed home medications unless otherwise directed.  PAIN CONTROL: Pain is best controlled by a usual combination of three different methods TOGETHER: Ice/Heat Over the counter pain medication Prescription pain medication Most patients will experience some swelling and bruising around the incisions.  Ice packs or heating pads (30-60 minutes up to 6 times a day) will help. Use ice for the first few days to help decrease swelling and bruising, then switch to heat to help relax tight/sore spots and speed recovery.  Some people prefer to use ice alone, heat alone, alternating between ice & heat.  Experiment to what works for you.  Swelling and bruising can take several weeks to resolve.   It is helpful to take an over-the-counter pain medication regularly for the first few days: Naproxen (Aleve, etc)   Two 220mg  tabs twice a day OR Ibuprofen  (Advil , etc) Three 200mg  tabs four times a day (every meal & bedtime) AND Acetaminophen  (Tylenol , etc) 500-650mg  four times a day (every meal & bedtime) A  prescription for pain medication (such as oxycodone, hydrocodone, tramadol, gabapentin, methocarbamol, etc) should be given to you upon discharge.  Take your pain medication as prescribed, IF NEEDED.  If you are having problems/concerns with the prescription medicine (does not control pain, nausea, vomiting, rash, itching, etc), please call us  [phone] to see if we need to switch you to a different pain medicine that will work better for you and/or control your side effect better. If you need a refill on your pain medication, please give us  48 hour notice.  contact your pharmacy.  They will contact our office to request authorization. Prescriptions will not be filled after 5 pm or on week-ends  Avoid getting constipated.   Between the surgery and the pain medications, it is common to experience some constipation.   Increasing fluid intake and taking a fiber supplement (such as Metamucil, Citrucel, FiberCon, MiraLax, etc) 1-2 times a day regularly will usually help prevent this problem from occurring.   A mild laxative (prune juice, Milk of Magnesia, MiraLax, etc) should be taken according to package directions if there are no bowel movements after 48 hours.   Watch out for diarrhea.   If you have many loose bowel movements, simplify your diet to bland foods & liquids for a few days.  Stop any stool softeners and decrease your fiber supplement.   Switching to mild anti-diarrheal medications (Kayopectate, Pepto Bismol) can help.   If this worsens or does not improve, please call us .  Wash / shower every day.  You may shower over the skin glue which is waterproof.  Do not soak or submerge incisions.  No rubbing, scrubbing, lotions or ointments to incisions.  Glue will flake off after about 2 weeks.   You may leave the incision open to air.  You may replace a dressing/Band-Aid to cover the incision for comfort if you wish.   ACTIVITIES as tolerated:   You may resume regular (light) daily activities beginning the next day--such as daily self-care, walking, climbing stairs--gradually increasing activities as tolerated.  If you can walk 30 minutes without difficulty, it is safe to try more intense activity such as jogging, treadmill, bicycling, low-impact aerobics, swimming, etc. Save the most intensive and strenuous activity for last such as sit-ups, heavy lifting, contact sports, etc  Refrain from any heavy lifting or straining until you are off narcotics for pain control.   DO NOT PUSH THROUGH PAIN.  Let pain be your guide: If it hurts to do something, don't do it.  Pain is your body warning you to avoid that activity for another week until the pain goes down. You may drive when you are no longer taking prescription pain medication, you can comfortably wear a seatbelt, and you can safely maneuver your car and apply brakes. You may have sexual intercourse when it is comfortable.  FOLLOW UP in our office Please call [facility] at [phone] to set up an appointment to see your surgeon in the office for a follow-up appointment approximately 2-3 weeks after your surgery. Make sure that you call for this appointment the day you arrive home to insure a convenient appointment time.  10. IF YOU HAVE DISABILITY OR FAMILY LEAVE FORMS, BRING THEM TO THE OFFICE FOR PROCESSING.  DO NOT GIVE THEM TO YOUR DOCTOR.   WHEN TO CALL US  [phone]: Poor pain control Reactions / problems with new medications (rash/itching, nausea, etc)  Fever over 101.5 F (38.5 C) Inability to urinate Nausea and/or vomiting Worsening swelling or bruising Continued bleeding from incision. Increased pain, redness, or drainage from the incision   The clinic staff is available to answer your questions during regular business  hours (8:30am-5pm).  Please don't hesitate to call and ask to speak to one of our nurses for clinical concerns.   If you have a medical emergency, go to the nearest emergency room or call 911.  A surgeon from [facility] is always on call at the [facility], [place] [address], [place], [place]  [address] ? MAIN: [phone] ? TOLL FREE: [phone] ?  FAX [phone] [email or web address]

## 2024-04-27 NOTE — Anesthesia Procedure Notes (Signed)
 Procedure Name: Intubation Date/Time: 04/27/2024 7:34 AM  Performed by: Alwyn Juba, CRNAPre-anesthesia Checklist: Patient identified, Emergency Drugs available, Suction available, Patient being monitored and Timeout performed Patient Re-evaluated:Patient Re-evaluated prior to induction Oxygen Delivery Method: Circle system utilized Preoxygenation: Pre-oxygenation with 100% oxygen Induction Type: IV induction Ventilation: Mask ventilation without difficulty Laryngoscope Size: Glidescope and 3 Grade View: Grade I Tube type: Oral Tube size: 6.5 mm Number of attempts: 1 Airway Equipment and Method: Stylet Placement Confirmation: ETT inserted through vocal cords under direct vision, positive ETCO2 and breath sounds checked- equal and bilateral Secured at: 22 cm Tube secured with: Tape Dental Injury: Teeth and Oropharynx as per pre-operative assessment  Comments: Easy mask airway, Glidescope 3 with grade 1 airway, atraumatic intubation, +/=BBS

## 2024-04-27 NOTE — Addendum Note (Signed)
 Addendum  created 04/27/24 1153 by Alwyn Juba, CRNA   Intraprocedure Event edited

## 2024-04-27 NOTE — Anesthesia Postprocedure Evaluation (Signed)
 Anesthesia Post Note  Patient: Madison Tate  Procedure(s) Performed: LAPAROSCOPY, DIAGNOSTIC LAPAROSCOPIC CHOLECYSTECTOMY     Patient location during evaluation: PACU Anesthesia Type: General Level of consciousness: awake and alert Pain management: pain level controlled Vital Signs Assessment: post-procedure vital signs reviewed and stable Respiratory status: spontaneous breathing, nonlabored ventilation, respiratory function stable and patient connected to nasal cannula oxygen Cardiovascular status: blood pressure returned to baseline and stable Postop Assessment: no apparent nausea or vomiting Anesthetic complications: no Comments: Easy mask ventilation and easy intubation with glidescope. Airway anatomy appeared normal with no atypical features appreciated under glidescope visualization.    No notable events documented.  Last Vitals:  Vitals:   04/27/24 0930 04/27/24 0939  BP: 132/78 124/76  Pulse: 70 64  Resp: 16 15  Temp:  36.8 C  SpO2: 94% 95%    Last Pain:  Vitals:   04/27/24 0939  TempSrc: Oral  PainSc: 3                  Dianca Owensby R Keeara Frees

## 2024-04-28 ENCOUNTER — Encounter (HOSPITAL_COMMUNITY): Payer: Self-pay | Admitting: Surgery

## 2024-04-30 LAB — SURGICAL PATHOLOGY
